# Patient Record
Sex: Male | Born: 1995
Health system: Southern US, Community
[De-identification: ages and names within clinical notes are randomized; demographics above are authoritative.]

## PROBLEM LIST (undated history)

## (undated) DIAGNOSIS — J45909 Unspecified asthma, uncomplicated: Secondary | ICD-10-CM

## (undated) DIAGNOSIS — I456 Pre-excitation syndrome: Secondary | ICD-10-CM

## (undated) HISTORY — DX: Pre-excitation syndrome: I45.6

## (undated) HISTORY — PX: CARDIAC ELECTROPHYSIOLOGY STUDY AND ABLATION: SHX1294

## (undated) NOTE — *Deleted (*Deleted)
HEMATOLOGY/ONCOLOGY CONSULTATION NOTE  Date of Service: 07/24/2020  Patient Care Team: Lewis Moccasin, MD as PCP - General (Family Medicine)  CHIEF COMPLAINTS/PURPOSE OF CONSULTATION:  Leukopenia   HISTORY OF PRESENTING ILLNESS:   Sergio Taylor is a wonderful 27 y.o. male who has been referred to [place] by Dr. Maryelizabeth Rowan for evaluation and management of Leukopenia. The pt reports that he is doing well overall.   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  The pt is not taking any medications regularly and he has not had any recent infections. He notes that he has had stable weight and stable, good energy levels. He denies taking any vitamins or supplements.   The pt notes that he incidentally noticed a mole in 2016 which has not changed in size, color or character.   The pt had left sided walking pneumonia when he was in fourth grade. He hasn't been received any pneumonia vaccinations and he also denies recurrent or frequent infections. The pt denies exposure to any chemicals or radiation or exotic animals.   Of note prior to the patient's visit today, pt has had a US Abdomen completed on 07/29/18 with results revealing No gallstones nor evidence of other hepatobiliary abnormality. No findings to explain the patient's elevated bilirubin. No acute intra-abdominal abnormality is observed.   The pt notes that he ate seafood earlier in the summer and developed SOB which resolved on its own after he presented to the ED on 03/15/18.   Most recent lab results (07/10/18) of CBC w/diff and CMP is as follows: all values are WNL except for WBC at 2.1k, RDW at 35.8, ANC automated at 38.5%, Total Bilirubin at 2.5.   On review of systems, pt reports stable and good energy levels, staying active, eating well, and denies fevers, chills, night sweats, cough, cold, flu-like  symptoms, SOB,CP, abdominal pains, changes in bowel habits, noticing any new lumps or bumps, frequent infections, unexpected weight loss, abdominal pains, testicular pain or swelling, leg swelling, lower abdominal pains, red/swollen joints, and any other symptoms.   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 . On Social Hx the pt denies alcohol use since 2017, denies recreational drug use, and denies smoking cigarettes. Denies any radiation or chemical exposure. He is a Consulting civil engineer at [employer].  On Family Hx the pt denies any blood disorders or liver disease. Paternal grandmother with lupus, and denies RA.  Interval History: Sergio Taylor returns today for management and evaluation of his neutropenia. The patient's last visit with [place] was on 07/29/2019. The pt reports that he is doing well overall.  The pt reports ***  Of note since the patient's last visit, pt has had *** completed on *** with results revealing ***.  Lab results today (07/24/20) of CBC w/diff and CMP is as follows: all values are WNL except for ***.  On review of systems, pt reports *** and denies ***and any other symptoms.   A&P: -Discussed pt labwork today, 07/24/20; *** -***  MEDICAL HISTORY:  Past Medical History:  Diagnosis Date  . Asthma   . 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 CARDIAC ELECTROPHYSIOLOGY STUDY AND ABLATION      SOCIAL HISTORY: Social History   Socioeconomic History  . Marital status: Single  Spouse name: Not on file  . Number of children: Not on file  . Years of education: Not on file  . Highest education level: Not on file  Occupational History  . Not on file  Tobacco Use  . Smoking status: Never Smoker  . Smokeless tobacco: Never Used  Substance and Sexual Activity  . Alcohol use: Never  . Drug use: Never  . Sexual activity: Not on file  Other Topics Concern   . Not on file  Social History Narrative  . Not on file   Social Determinants of Health   Financial Resource Strain:   . Difficulty of Paying Living Expenses: Not on file  Food Insecurity:   . Worried About Programme researcher, broadcasting/film/video in the Last Year: Not on file  . Ran Out of Food in the Last Year: Not on file  Transportation Needs:   . Lack of Transportation (Medical): Not on file  . Lack of Transportation (Non-Medical): Not on file  Physical Activity:   . Days of Exercise per Week: Not on file  . Minutes of Exercise per Session: Not on file  Stress:   . Feeling of Stress : Not on file  Social Connections:   . Frequency of Communication with Friends and Family: Not on file  . Frequency of Social Gatherings with Friends and Family: Not on file  . Attends Religious Services: Not on file  . Active Member of Clubs or Organizations: Not on file  . Attends Banker Meetings: Not on file  . Marital Status: Not on file  Intimate Partner Violence:   . Fear of Current or Ex-Partner: Not on file  . Emotionally Abused: Not on file  . Physically Abused: Not on file  . Sexually Abused: Not on file    FAMILY HISTORY: No family history on file.  ALLERGIES:  has No Known Allergies.  MEDICATIONS:  No current outpatient medications on file.   No current facility-administered medications for this visit.    REVIEW OF SYSTEMS:   A 10+ POINT REVIEW OF SYSTEMS WAS OBTAINED including neurology, dermatology, psychiatry, cardiac, respiratory, lymph, extremities, GI, GU, Musculoskeletal, constitutional, breasts, reproductive, HEENT.  All pertinent positives are noted in the HPI.  All others are negative.   PHYSICAL EXAMINATION:  *** GENERAL:alert, in no acute distress and comfortable SKIN: no acute rashes, no significant lesions EYES: conjunctiva are pink and non-injected, sclera anicteric OROPHARYNX: MMM, no exudates, no oropharyngeal erythema or ulceration NECK: supple, no JVD  LYMPH:  no palpable lymphadenopathy in the cervical, axillary or inguinal regions LUNGS: clear to auscultation b/l with normal respiratory effort HEART: regular rate & rhythm ABDOMEN:  normoactive bowel sounds , non tender, not distended. No palpable hepatosplenomegaly.  Extremity: no pedal edema PSYCH: alert & oriented x 3 with fluent speech NEURO: no focal motor/sensory deficits  LABORATORY DATA:  I have reviewed the data as listed  . CBC Latest Ref Rng & Units 07/29/2019 01/27/2019 07/29/2018  WBC 4.0 - 10.5 K/uL 2.9(L) 3.4(L) 2.3(L)  Hemoglobin 13.0 - 17.0 g/dL 16.1 09.6 04.5  Hematocrit 39 - 52 % 43.0 43.1 44.7  Platelets 150 - 400 K/uL 261 229 247   ANC 1000 . CBC    Component Value Date/Time   WBC 2.9 (L) 07/29/2019 1320   RBC 4.81 07/29/2019 1320   HGB 14.6 07/29/2019 1320   HGB 13.7 01/24/2018 1434   HCT 43.0 07/29/2019 1320   HCT 41.7 07/29/2018 1359   PLT 261 07/29/2019 1320  PLT 246 01/24/2018 1434   MCV 89.4 07/29/2019 1320   MCH 30.4 07/29/2019 1320   MCHC 34.0 07/29/2019 1320   RDW 10.9 (L) 07/29/2019 1320   LYMPHSABS 1.3 07/29/2019 1320   MONOABS 0.4 07/29/2019 1320   EOSABS 0.1 07/29/2019 1320   BASOSABS 0.0 07/29/2019 1320   . CMP Latest Ref Rng & Units 07/29/2019 07/29/2018 03/16/2018  Glucose 70 - 99 mg/dL 80 78 96  BUN 6 - 20 mg/dL 11 8 10   Creatinine 0.61 - 1.24 mg/dL 4.09 8.11 9.14  Sodium 135 - 145 mmol/L 141 144 140  Potassium 3.5 - 5.1 mmol/L 3.9 4.1 3.7  Chloride 98 - 111 mmol/L 105 104 104  CO2 22 - 32 mmol/L 27 31 28   Calcium 8.9 - 10.3 mg/dL 9.3 9.4 9.0  Total Protein 6.5 - 8.1 g/dL 7.5 7.6 -  Total Bilirubin 0.3 - 1.2 mg/dL 2.5(H) 3.0(H) -  Alkaline Phos 38 - 126 U/L 54 56 -  AST 15 - 41 U/L 34 19 -  ALT 0 - 44 U/L 31 12 -    Component     Latest Ref Rng & Units 07/29/2018  Retic Ct Pct     0.4 - 3.1 % 1.1  RBC.     4.22 - 5.81 MIL/uL 4.83  Retic Count, Absolute     19.0 - 186.0 K/uL 51.2  Immature Retic Fract     2.3 -  15.9 % 3.0  Reticulocyte Hemoglobin     >27.9 pg 35.5  Folate, Hemolysate     Not Estab. ng/mL 487.7  HCT     37.5 - 51.0 % 41.7  Folate, RBC     >498 ng/mL 1,170  Vitamin B12     180 - 914 pg/mL 500  LDH     98 - 192 U/L 146  Sed Rate     0 - 16 mm/hr 0     07/10/18 CBC w/diff:    07/10/18 CMP:    RADIOGRAPHIC STUDIES: I have personally reviewed the radiological images as listed and agreed with the findings in the report. No results found.  ASSESSMENT & PLAN:   89 y.o. male with  1. 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 03/16/18 ANC normal at 2.9k, 01/24/18 ANC at 1.5k.  12/11/17 ANC at 700. No anemia or thrombocytopenia at any point. Non-progressive pattern demonstrated by neutropenia. Reviewed the 01/24/18 work up from prior hematology appointment, nl ANCA titers, no Hep B, no Hep C, nor Rheumatoid factor   2. Hyperbilirubinemia CMP from 07/10/18; Total Bilirubin at 2.5, Pt's labs from 12/11/17 shows Total Bilirubin at 1.8.  07/29/18 US Abdomen revealed No gallstones nor evidence of other hepatobiliary abnormality. No findings to explain the patient's elevated bilirubin. No acute intra-abdominal abnormality is observed.   PLAN: ***  FOLLOW UP: ***  The total time spent in the appt was *** minutes and more than 50% was on counseling and direct patient cares.  All of the patient's questions were answered with apparent satisfaction. The patient knows to call the clinic with any problems, questions or concerns.    Wyvonnia Lora MD MS AAHIVMS [facility] Hematology/Oncology Physician [facility]  (Office):       [phone] (Work cell):  [phone] (Fax):           [phone]  07/24/2020 8:46 PM  I, Carollee Herter, am acting as a scribe for Dr. Wyvonnia Lora.   {Add  Scribe Attestation Statement}

---
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
## 2016-10-26 DIAGNOSIS — L720 Epidermal cyst: Secondary | ICD-10-CM | POA: Insufficient documentation

## 2017-03-12 DIAGNOSIS — I456 Pre-excitation syndrome: Secondary | ICD-10-CM

## 2017-03-12 DIAGNOSIS — R002 Palpitations: Secondary | ICD-10-CM | POA: Insufficient documentation

## 2017-03-12 HISTORY — DX: Pre-excitation syndrome: I45.6

## 2017-05-27 DIAGNOSIS — I456 Pre-excitation syndrome: Secondary | ICD-10-CM | POA: Insufficient documentation

## 2017-10-10 DIAGNOSIS — I471 Supraventricular tachycardia: Secondary | ICD-10-CM | POA: Insufficient documentation

## 2017-12-31 ENCOUNTER — Encounter: Payer: Self-pay | Admitting: Hematology and Oncology

## 2018-01-24 ENCOUNTER — Encounter: Payer: Self-pay | Admitting: Hematology and Oncology

## 2018-01-24 ENCOUNTER — Telehealth: Payer: Self-pay

## 2018-01-24 ENCOUNTER — Inpatient Hospital Stay: Payer: 59 | Attending: Hematology and Oncology | Admitting: Hematology and Oncology

## 2018-01-24 ENCOUNTER — Inpatient Hospital Stay: Payer: 59

## 2018-01-24 VITALS — BP 132/69 | HR 58 | Temp 97.6°F | Resp 18 | Ht 71.0 in | Wt 161.2 lb

## 2018-01-24 DIAGNOSIS — D709 Neutropenia, unspecified: Secondary | ICD-10-CM | POA: Insufficient documentation

## 2018-01-24 DIAGNOSIS — I456 Pre-excitation syndrome: Secondary | ICD-10-CM | POA: Diagnosis not present

## 2018-01-24 LAB — CMP (CANCER CENTER ONLY)
ALBUMIN: 4 g/dL (ref 3.5–5.0)
ALK PHOS: 87 U/L (ref 40–150)
ALT: 32 U/L (ref 0–55)
ANION GAP: 9 (ref 3–11)
AST: 36 U/L — AB (ref 5–34)
BUN: 9 mg/dL (ref 7–26)
CALCIUM: 9.9 mg/dL (ref 8.4–10.4)
CO2: 26 mmol/L (ref 22–29)
CREATININE: 0.97 mg/dL (ref 0.70–1.30)
Chloride: 108 mmol/L (ref 98–109)
GFR, Est AFR Am: >60 mL/min (ref >60)
GFR, Estimated: >60 mL/min (ref >60)
GLUCOSE: 72 mg/dL (ref 70–140)
Potassium: 3.7 mmol/L (ref 3.5–5.1)
Sodium: 143 mmol/L (ref 136–145)
Total Bilirubin: 1.1 mg/dL (ref 0.2–1.2)
Total Protein: 8 g/dL (ref 6.4–8.3)

## 2018-01-24 LAB — CBC WITH DIFFERENTIAL (CANCER CENTER ONLY)
Basophils Absolute: 0 10*3/uL (ref 0.0–0.1)
Basophils Relative: 0 %
EOS PCT: 4 %
Eosinophils Absolute: 0.1 10*3/uL (ref 0.0–0.5)
HCT: 40.9 % (ref 38.4–49.9)
Hemoglobin: 13.7 g/dL (ref 13.0–17.1)
Lymphocytes Relative: 42 %
Lymphs Abs: 1.6 10*3/uL (ref 0.9–3.3)
MCH: 30.7 pg (ref 27.2–33.4)
MCHC: 33.5 g/dL (ref 32.0–36.0)
MCV: 91.7 fL (ref 79.3–98.0)
MONO ABS: 0.6 10*3/uL (ref 0.1–0.9)
MONOS PCT: 15 %
Neutro Abs: 1.5 10*3/uL (ref 1.5–6.5)
Neutrophils Relative %: 39 %
PLATELETS: 246 10*3/uL (ref 140–400)
RBC: 4.46 MIL/uL (ref 4.20–5.82)
RDW: 11.6 % (ref 11.0–14.6)
WBC Count: 3.8 10*3/uL — ABNORMAL LOW (ref 4.0–10.3)

## 2018-01-24 NOTE — Telephone Encounter (Signed)
Printed avs and calender of upcoming appointment. Per 4/26 los. Perlov had aval. on 5/3 so he said to place patient on 5/6.

## 2018-01-25 LAB — EPSTEIN-BARR VIRUS EARLY D ANTIGEN ANTIBODY, IGG

## 2018-01-25 LAB — ANCA TITERS
Atypical P-ANCA titer: <1:20 {titer}
P-ANCA: <1:20 {titer}

## 2018-01-25 LAB — HEPATITIS C ANTIBODY (REFLEX): HCV Ab: <0.1 s/co ratio (ref 0.0–0.9)

## 2018-01-25 LAB — CMV ANTIBODY, IGG (EIA)

## 2018-01-25 LAB — HCV COMMENT:

## 2018-01-25 LAB — HEPATITIS B SURFACE ANTIBODY,QUALITATIVE: HEP B S AB: NONREACTIVE

## 2018-01-25 LAB — HEPATITIS B SURFACE ANTIGEN: HEP B S AG: NEGATIVE

## 2018-01-25 LAB — EPSTEIN-BARR VIRUS VCA, IGM

## 2018-01-25 LAB — CMV IGM: CMV IgM: <30 AU/mL (ref 0.0–29.9)

## 2018-01-25 LAB — HEPATITIS B CORE ANTIBODY, TOTAL: HEP B C TOTAL AB: NEGATIVE

## 2018-01-25 LAB — EPSTEIN-BARR VIRUS NUCLEAR ANTIGEN ANTIBODY, IGG

## 2018-01-25 LAB — RHEUMATOID FACTOR: Rhuematoid fact SerPl-aCnc: <10 IU/mL (ref 0.0–13.9)

## 2018-01-25 LAB — EPSTEIN-BARR VIRUS VCA, IGG: EBV VCA IGG: 19 U/mL — AB (ref 0.0–17.9)

## 2018-01-27 LAB — ANTINUCLEAR ANTIBODIES, IFA: ANA Ab, IFA: NEGATIVE

## 2018-01-30 ENCOUNTER — Telehealth: Payer: Self-pay

## 2018-01-30 NOTE — Telephone Encounter (Signed)
Spoke to patient concerning his appointment change and informed him that the date he was requesting was unavailable. Per 5/2 phone message return

## 2018-01-31 ENCOUNTER — Telehealth: Payer: Self-pay

## 2018-01-31 NOTE — Telephone Encounter (Signed)
Patient called to reschedule appointment and asked if lab results could be reviewed by Dr. Perlov to Gweneth Dimitri if a follow up visit is needed. Dr. Gweneth Dimitri reviewed results and stated patient does not need to come back for follow up. Called patient and made him aware. Patient verbalized understanding.

## 2018-02-03 ENCOUNTER — Ambulatory Visit: Payer: 59 | Admitting: Hematology and Oncology

## 2018-02-13 ENCOUNTER — Encounter: Payer: Self-pay | Admitting: Hematology and Oncology

## 2018-02-13 DIAGNOSIS — D709 Neutropenia, unspecified: Secondary | ICD-10-CM | POA: Insufficient documentation

## 2018-02-13 NOTE — Assessment & Plan Note (Signed)
22 y.o. overall healthy young male with minimal past medical history presenting with neutropenia of unknown duration.  First discovered in December 2018.  There has been some possible progression over the following 3 months.  No significant changes in the hemoglobin or platelets over that time.  This has been accompanied by hyperbilirubinemia.  Patient is presently asymptomatic, differential is extremely broad starting with congenital/ethnic neutropenia followed by potential previous viral exposures and possible medication side effects including flecainide, but it is uncommon.  Plan: - Obtain additional labs as outlined below. -Return to clinic in 1 week to review the findings.Marland Kitchen

## 2018-02-13 NOTE — Progress Notes (Signed)
[facility] New Visit:  Assessment: Neutropenia ([place]) 22 y.o. overall healthy young male with minimal past medical history presenting with neutropenia of unknown duration.  First discovered in December 2018.  There has been some possible progression over the following 3 months.  No significant changes in the hemoglobin or platelets over that time.  This has been accompanied by hyperbilirubinemia.  Patient is presently asymptomatic, differential is extremely broad starting with congenital/ethnic neutropenia followed by potential previous viral exposures and possible medication side effects including flecainide, but it is uncommon.  Plan: - Obtain additional labs as outlined below. -Return to clinic in 1 week to review the findings..   Voice recognition software was used and creation of this note. Despite my best effort at editing the text, some misspelling/errors may have occurred. Orders Placed This Encounter  Procedures  . CBC with Differential (Cancer Center Only)    Standing Status:   Future    Number of Occurrences:   1    Standing Expiration Date:   01/25/2019  . CMP ([place] [facility] only)    Standing Status:   Future    Number of Occurrences:   1    Standing Expiration Date:   01/25/2019  . ANA, IFA (with reflex)    Standing Status:   Future    Number of Occurrences:   1    Standing Expiration Date:   01/24/2019  . Rheumatoid factor    Standing Status:   Future    Number of Occurrences:   1    Standing Expiration Date:   01/24/2019  . ANCA [place]    Standing Status:   Future    Number of Occurrences:   1    Standing Expiration Date:   01/24/2019  . CMV IgM    Standing Status:   Future    Number of Occurrences:   1    Standing Expiration Date:   01/24/2019  . CMV antibody, IgG (EIA)    Standing Status:   Future    Number of Occurrences:   1    Standing Expiration Date:   01/24/2019  . Epstein-Barr virus VCA, IgG    Standing Status:   Future    Number of  Occurrences:   1    Standing Expiration Date:   01/24/2019  . Epstein-Barr virus VCA, IgM    Standing Status:   Future    Number of Occurrences:   1    Standing Expiration Date:   01/24/2019  . Epstein-Barr virus early D antigen antibody, IgG    Standing Status:   Future    Number of Occurrences:   1    Standing Expiration Date:   01/24/2019  . Epstein-Barr virus nuclear antigen antibody, IgG    Standing Status:   Future    Number of Occurrences:   1    Standing Expiration Date:   01/24/2019  . Hepatitis B surface antibody    Standing Status:   Future    Number of Occurrences:   1    Standing Expiration Date:   01/24/2019  . Hepatitis B surface antigen    Standing Status:   Future    Number of Occurrences:   1    Standing Expiration Date:   01/24/2019  . Hepatitis B core antibody, total    Standing Status:   Future    Number of Occurrences:   1    Standing Expiration Date:   01/24/2019  . Hepatitis C  antibody (reflex if positive)    Standing Status:   Future    Number of Occurrences:   1    Standing Expiration Date:   01/24/2019    All questions were answered.  . The patient knows to call the clinic with any problems, questions or concerns.  This note was electronically signed.    History of Presenting Illness Sergio Taylor 22 y.o. presenting to the [facility] for evaluation of neutropenic leukopenia, referred by Dr Fanny Bien.  Patient has no chronic medical conditions and does not recall ever having blood drawn prior to December of last year.  Patient denies having any significant recent illness other than feeling sick with what seems to be a viral infection approximately 1.5 weeks ago which resolved on its own.  Patient does not recall any significant periods of illness prior to December 2018 presentation.  At this time, patient denies fevers, chills, night sweats.  No changes to appetite, energy tolerance, or activities of daily living.  Patient denies sinus pain,  oral ulcers, hair loss, sore throat, swollen lymph nodes in the neck, armpits, or groin.  No shortness of breath, chest pain, or cough.  No difficulty swallowing, nausea, vomiting, early satiety, abdominal pain, diarrhea, or constipation.  No dysuria or hematuria..  Oncological/hematological History: --Labs, 09/30/17: WBC 2.5, ANC 1.0, ALC 1.2, Mono 0.2    Hgb 14.8, Plt 249; tBili 2.0; TSH 1.37, HIV negative --Labs, 12/11/17: WBC 2.7, ANC 0.7, ALC 1.6, Mono 0.4, Eos 0.1, Baso 0.1,  Hgb 14.3, Plt 273; tBili 1.8   Medical History: Past Medical History:  Diagnosis Date  . WPW syndrome 03/12/2017    Surgical History: History reviewed. No pertinent surgical history.  Family History: No family history on file.  Social History: Social History   Socioeconomic History  . Marital status: Single    Spouse name: Not on file  . Number of children: Not on file  . Years of education: Not on file  . Highest education level: Not on file  Occupational History  . Not on file  Social Needs  . Financial resource strain: Not on file  . Food insecurity:    Worry: Not on file    Inability: Not on file  . Transportation needs:    Medical: Not on file    Non-medical: Not on file  Tobacco Use  . Smoking status: Not on file  Substance and Sexual Activity  . Alcohol use: Not on file  . Drug use: Not on file  . Sexual activity: Not on file  Lifestyle  . Physical activity:    Days per week: Not on file    Minutes per session: Not on file  . Stress: Not on file  Relationships  . Social connections:    Talks on phone: Not on file    Gets together: Not on file    Attends religious service: Not on file    Active member of club or organization: Not on file    Attends meetings of clubs or organizations: Not on file    Relationship status: Not on file  . Intimate partner violence:    Fear of current or ex partner: Not on file    Emotionally abused: Not on file    Physically abused: Not on file     Forced sexual activity: Not on file  Other Topics Concern  . Not on file  Social History Narrative  . Not on file    Allergies: Not on File  Medications:  No current outpatient medications on file.   No current facility-administered medications for this visit.     Review of Systems: Review of Systems  All other systems reviewed and are negative.    PHYSICAL EXAMINATION Blood pressure 132/69, pulse (!) 58, temperature 97.6 F (36.4 C), temperature source Oral, resp. rate 18, height 5' 11"  (1.803 m), weight 161 lb 3.2 oz (73.1 kg), SpO2 100 %.  ECOG PERFORMANCE STATUS: 0 - Asymptomatic  Physical Exam  Constitutional: He is oriented to person, place, and time. He appears well-developed and well-nourished. No distress.  HENT:  Head: Normocephalic and atraumatic.  Mouth/Throat: Oropharynx is clear and moist. No oropharyngeal exudate.  Eyes: Pupils are equal, round, and reactive to light. Conjunctivae and EOM are normal. No scleral icterus.  Neck: No thyromegaly present.  Cardiovascular: Normal rate, regular rhythm, normal heart sounds and intact distal pulses. Exam reveals no gallop and no friction rub.  No murmur heard. Pulmonary/Chest: Effort normal and breath sounds normal. No stridor. No respiratory distress. He has no wheezes. He has no rales.  Abdominal: Soft. Bowel sounds are normal. He exhibits no distension and no mass. There is no tenderness. There is no guarding.  Musculoskeletal: He exhibits no edema.  Lymphadenopathy:    He has no cervical adenopathy.  Neurological: He is alert and oriented to person, place, and time. He displays normal reflexes. No cranial nerve deficit or sensory deficit.  Skin: Skin is warm and dry. No rash noted. He is not diaphoretic. No erythema. No pallor.     LABORATORY DATA: I have personally reviewed the data as listed: Clinical Support on 01/24/2018  Component Date Value Ref Range Status  . HCV Ab 01/24/2018 <0.1  0.0 - 0.9  s/co ratio Final   Comment: (NOTE) Performed At: [facility] [place], [place] [id number] Rush Farmer MD UD:[id number] Performed at [facility], [place] [address], [place], [place] [address]   . Hep B Core Total Ab 01/24/2018 Negative  Negative Final   Comment: (NOTE) Performed At: [facility] [place], [place] [id number] Rush Farmer MD [id number] Performed at [facility], [place] [address] [place], [place] [address]   . Hepatitis B Surface Ag 01/24/2018 Negative  Negative Final   Comment: (NOTE) Performed At: [facility] [place], [place] [id number] Rush Farmer MD [id number] Performed at [facility], [place] [address], [place], [place] [address]   . Hep B S Ab 01/24/2018 Non Reactive   Final   Comment: (NOTE)              Non Reactive: Inconsistent with immunity,                            less than 10 mIU/mL              Reactive:     Consistent with immunity,                            greater than 9.9 mIU/mL Performed At: [facility] [place], [place] [id number] Rush Farmer MD [id number] Performed at [facility], [place] [address] [place],  [address]   . EBV NA IgG 01/24/2018 <18.0  0.0 - 17.9 U/mL Final   Comment: (NOTE)  Negative        <18.0                                 Equivocal 18.0 - 21.9                                 Positive        >21.9 Performed At: [facility] [place], [place] [id number] Rush Farmer MD [id number] Performed at [facility], [place] [address] [place], [place] [address]   . EBV Early Antigen Ab, IgG 01/24/2018 <9.0  0.0 - 8.9 U/mL Final   Comment: (NOTE)                                 Negative        < 9.0                                  Equivocal  9.0 - 10.9                                 Positive        >10.9 Performed At: [facility] [place], [place] [id number] Rush Farmer MD HW:[id number] Performed at [facility], [place] [address] [place], [place] [address]   . EBV VCA IgM 01/24/2018 <36.0  0.0 - 35.9 U/mL Final   Comment: (NOTE)                                 Negative        <36.0                                 Equivocal 36.0 - 43.9                                 Positive        >43.9 Performed At: [facility] [place], [place] [id number] Rush Farmer MD [id number] Performed at [facility], [place] [address], [place], [place] [address]   . EBV VCA IgG 01/24/2018 19.0* 0.0 - 17.9 U/mL Final   Comment: (NOTE) A second sample should be collected and tested no less than 2-4 weeks.                                 Negative        <18.0                                 Equivocal 18.0 - 21.9                                 Positive        >21.9 Performed At:  [facility] [place], [place] [id number] Rush Farmer MD [id number] Performed at [facility], [place] [address] [place], [place] [address]   . CMV Ab - IgG 01/24/2018 <0.60  0.00 - 0.59 U/mL Final   Comment: (NOTE)                               Negative          <0.60                               Equivocal   0.60 - 0.69                               Positive          >0.69 Performed At: [facility] [place], [place] [id number] Rush Farmer MD [id number] Performed at [facility], [place] [address], [place], [place] [address]   . CMV IgM 01/24/2018 <30.0  0.0 - 29.9 AU/mL Final   Comment: (NOTE)                                Negative         <30.0                                Equivocal  30.0 - 34.9                                Positive          >34.9 A positive result is generally indicative of acute infection, reactivation or persistent IgM production. Performed At: [facility] [place], [place] [id number] Rush Farmer MD [id number] Performed at [facility], [place] [address], [place], [place] [address]   . C-ANCA 01/24/2018 <1:20  Neg:<1:20 titer Final  . P-ANCA 01/24/2018 <1:20  Neg:<1:20 titer Final   Comment: (NOTE) The presence of positive fluorescence exhibiting P-ANCA or C-ANCA patterns alone is not specific for the diagnosis of Wegener's Granulomatosis (WG) or microscopic polyangiitis. Decisions about treatment should not be based solely on ANCA IFA results.  The International ANCA [employer] Consensus recommends follow up testing of positive sera with both PR-3 and MPO-ANCA enzyme immunoassays. As many as 5% serum samples are positive only by EIA. Ref. AM J Clin Pathol 1999;111:507-513.   Marland Kitchen Atypical P-ANCA titer 01/24/2018 <1:20  Neg:<1:20 titer Final   Comment: (NOTE) The atypical pANCA pattern has been observed in a significant percentage of patients with ulcerative colitis, primary sclerosing cholangitis and autoimmune hepatitis. Performed At: [facility] [place], [place] [id number] Rush Farmer MD SA:[id number] Performed at [facility] Laboratory, [place] [address] [place], [place] [address]   . Rhuematoid fact SerPl-aCnc 01/24/2018 <10.0  0.0 - 13.9 IU/mL Final   Comment: (NOTE) Performed At: [facility] [place], [place] [id number] Rush Farmer MD [id number] Performed at [facility], [place] [address], [place], [place] [address]   . ANA Ab, IFA 01/24/2018 Negative   Final   Comment: (NOTE)  Negative   <1:80                                     Borderline  1:80                                     Positive   >1:80 Performed At: [facility] [place], [place] [id number] Rush Farmer MD IZ:[id number] Performed at [facility], [place] [address] [place], [place] [address]   . Sodium 01/24/2018 143  136 - 145 mmol/L Final  . Potassium 01/24/2018 3.7  3.5 - 5.1 mmol/L Final  . Chloride 01/24/2018 108  98 - 109 mmol/L Final  . CO2 01/24/2018 26  22 - 29 mmol/L Final  . Glucose, Bld 01/24/2018 72  70 - 140 mg/dL Final  . BUN 01/24/2018 9  7 - 26 mg/dL Final  . Creatinine 01/24/2018 0.97  0.70 - 1.30 mg/dL Final  . Calcium 01/24/2018 9.9  8.4 - 10.4 mg/dL Final  . Total Protein 01/24/2018 8.0  6.4 - 8.3 g/dL Final  . Albumin 01/24/2018 4.0  3.5 - 5.0 g/dL Final  . AST 01/24/2018 36* 5 - 34 U/L Final  . ALT 01/24/2018 32  0 - 55 U/L Final  . Alkaline Phosphatase 01/24/2018 87  40 - 150 U/L Final  . Total Bilirubin 01/24/2018 1.1  0.2 - 1.2 mg/dL Final  . GFR, Est Non Af Am 01/24/2018 >60  >60 mL/min Final  . GFR, Est AFR Am 01/24/2018 >60  >60 mL/min Final   Comment: (NOTE) The eGFR has been calculated using the CKD EPI equation. This calculation has not been validated in all clinical situations. eGFR's persistently <60 mL/min signify possible Chronic Kidney Disease.   Georgiann Hahn gap 01/24/2018 9  3 - 11 Final   Performed at [facility], [place] [address], [place], [place] [address]  . WBC Count 01/24/2018 3.8* 4.0 - 10.3 K/uL Final  . RBC 01/24/2018 4.46  4.20 - 5.82 MIL/uL Final  . Hemoglobin 01/24/2018 13.7  13.0 - 17.1 g/dL Final  . HCT 01/24/2018 40.9  38.4 - 49.9 % Final  . MCV 01/24/2018 91.7  79.3 - 98.0 fL Final  . MCH 01/24/2018 30.7  27.2 - 33.4 pg Final  . MCHC 01/24/2018 33.5  32.0 - 36.0 g/dL Final  . RDW 01/24/2018 11.6  11.0 - 14.6 % Final  . Platelet Count 01/24/2018 246  140 - 400 K/uL Final  . Neutrophils Relative % 01/24/2018 39  % Final  . Neutro Abs 01/24/2018 1.5  1.5 - 6.5 K/uL Final  . Lymphocytes Relative 01/24/2018 42   % Final  . Lymphs Abs 01/24/2018 1.6  0.9 - 3.3 K/uL Final  . Monocytes Relative 01/24/2018 15  % Final  . Monocytes Absolute 01/24/2018 0.6  0.1 - 0.9 K/uL Final  . Eosinophils Relative 01/24/2018 4  % Final  . Eosinophils Absolute 01/24/2018 0.1  0.0 - 0.5 K/uL Final  . Basophils Relative 01/24/2018 0  % Final  . Basophils Absolute 01/24/2018 0.0  0.0 - 0.1 K/uL Final   Performed at [facility], [place] [address] [place], [place] [address]  . Comment: 01/24/2018 Comment   Final   Comment: (NOTE) Non reactive HCV antibody screen is consistent with  no HCV infection, unless recent infection is suspected or other evidence exists to indicate HCV infection. Performed At: [facility] [place], [place] [id number] Rush Farmer MD [id number] Performed at [facility], [place] [address] [place], [place] [address]          Ardath Sax, MD

## 2018-03-15 ENCOUNTER — Emergency Department (HOSPITAL_COMMUNITY): Payer: 59

## 2018-03-15 ENCOUNTER — Encounter (HOSPITAL_COMMUNITY): Payer: Self-pay | Admitting: Emergency Medicine

## 2018-03-15 ENCOUNTER — Emergency Department (HOSPITAL_COMMUNITY)
Admission: EM | Admit: 2018-03-15 | Discharge: 2018-03-16 | Disposition: A | Discharge status: Home / Self Care | Payer: 59 | Attending: Emergency Medicine | Admitting: Emergency Medicine

## 2018-03-15 DIAGNOSIS — R0602 Shortness of breath: Secondary | ICD-10-CM | POA: Diagnosis not present

## 2018-03-15 DIAGNOSIS — J45909 Unspecified asthma, uncomplicated: Secondary | ICD-10-CM | POA: Insufficient documentation

## 2018-03-15 HISTORY — DX: Unspecified asthma, uncomplicated: J45.909

## 2018-03-15 MED ORDER — ALBUTEROL SULFATE (2.5 MG/3ML) 0.083% IN NEBU
5.0000 mg | INHALATION_SOLUTION | Freq: Once | RESPIRATORY_TRACT | Status: AC
  Administered 2018-03-15: 5 mg via RESPIRATORY_TRACT
  Filled 2018-03-15: qty 6

## 2018-03-15 NOTE — ED Notes (Signed)
Repeat EKG given to Dr Clayborne DanaMesner

## 2018-03-15 NOTE — ED Triage Notes (Signed)
Reports possible allergic reaction tonight.  C/o sob.  Hx of asthma but no issues in years.

## 2018-03-15 NOTE — ED Notes (Signed)
Repeat in 10 to 15 min per Dr Clayborne DanaMesner

## 2018-03-16 LAB — CBC WITH DIFFERENTIAL/PLATELET
Abs Immature Granulocytes: 0 10*3/uL (ref 0.0–0.1)
BASOS PCT: 0 %
Basophils Absolute: 0 10*3/uL (ref 0.0–0.1)
EOS ABS: 0.1 10*3/uL (ref 0.0–0.7)
EOS PCT: 1 %
HEMATOCRIT: 41.7 % (ref 39.0–52.0)
Hemoglobin: 13.6 g/dL (ref 13.0–17.0)
Immature Granulocytes: 0 %
LYMPHS ABS: 1.6 10*3/uL (ref 0.7–4.0)
Lymphocytes Relative: 31 %
MCH: 30 pg (ref 26.0–34.0)
MCHC: 32.6 g/dL (ref 30.0–36.0)
MCV: 91.9 fL (ref 78.0–100.0)
Monocytes Absolute: 0.5 10*3/uL (ref 0.1–1.0)
Monocytes Relative: 11 %
Neutro Abs: 2.9 10*3/uL (ref 1.7–7.7)
Neutrophils Relative %: 57 %
Platelets: 224 10*3/uL (ref 150–400)
RBC: 4.54 MIL/uL (ref 4.22–5.81)
RDW: 11.1 % — AB (ref 11.5–15.5)
WBC: 5.1 10*3/uL (ref 4.0–10.5)

## 2018-03-16 LAB — BASIC METABOLIC PANEL
Anion gap: 8 (ref 5–15)
BUN: 10 mg/dL (ref 6–20)
CALCIUM: 9 mg/dL (ref 8.9–10.3)
CHLORIDE: 104 mmol/L (ref 101–111)
CO2: 28 mmol/L (ref 22–32)
CREATININE: 1 mg/dL (ref 0.61–1.24)
GFR calc Af Amer: >60 mL/min (ref >60)
GFR calc non Af Amer: >60 mL/min (ref >60)
Glucose, Bld: 96 mg/dL (ref 65–99)
Potassium: 3.7 mmol/L (ref 3.5–5.1)
SODIUM: 140 mmol/L (ref 135–145)

## 2018-03-16 LAB — I-STAT TROPONIN, ED: Troponin i, poc: 0 ng/mL (ref 0.00–0.08)

## 2018-03-16 NOTE — ED Provider Notes (Signed)
[facility] EMERGENCY DEPARTMENT Provider Note   CSN: [id number] Arrival date & time: 03/15/18  2243     History   Chief Complaint Chief Complaint  Patient presents with  . Shortness of Breath    HPI Sergio Taylor is a 22 y.o. male.  HPI 22 year old AA male past medical history significant for asthma and WPW syndrome with ablation in 2018 presents to the emergency department today for evaluation of chest tightness and shortness of breath.  Patient states that he is eating with his family this evening in a restaurant approximately 9 PM when he felt chest tightness and some shortness of breath.  Patient denied any wheezing, nausea, vomiting, diaphoresis, palpitations, throat swelling sensation, lightheadedness or dizziness.  Patient was concerned that he may be having a possible allergic reaction to something that he ate.  He has no history of allergies.  Patient does report history of asthma but has had no issues in many years.  Patient also states he has a history of will Parkinson's white requiring ablation last year and 1 to make sure that he was okay this evening.  Patient states that since arriving to the ED his symptoms have significantly improved.  He denies any chest tightness at this time.  Denies any shortness of breath.  Patient was given an albuterol treatment in triage.  Has a history of DVT/PE, prolonged immobilization, recent hospitalization/surgeries, unilateral leg swelling or calf tenderness, hemoptysis, tobacco use.  Pt denies any fever, chill, ha, vision changes, lightheadedness, dizziness, congestion, neck pain,  cough, abd pain, n/v/d, urinary symptoms, change in bowel habits, melena, hematochezia, lower extremity paresthesias.  Past Medical History:  Diagnosis Date  . Asthma   . WPW syndrome 03/12/2017    Patient Active Problem List   Diagnosis Date Noted  . Neutropenia (HCC) 02/13/2018  . Atrial tachycardia (HCC) 10/10/2017  . Evelene Croon Parkinson  White pattern seen on electrocardiogram 05/27/2017  . Heart palpitations 03/12/2017  . WPW syndrome 03/12/2017  . Epidermal inclusion cyst 10/26/2016  . Acne 04/17/2013    Past Surgical History:  Procedure Laterality Date  . CARDIAC ELECTROPHYSIOLOGY STUDY AND ABLATION          Home Medications    Prior to Admission medications   Not on File    Family History No family history on file.  Social History Social History   Tobacco Use  . Smoking status: Never Smoker  . Smokeless tobacco: Never Used  Substance Use Topics  . Alcohol use: Never    Frequency: Never  . Drug use: Never     Allergies   Patient has no known allergies.   Review of Systems Review of Systems  All other systems reviewed and are negative.    Physical Exam Updated Vital Signs BP 102/71 (BP Location: Left Arm)   Pulse 66   Temp 98 F (36.7 C) (Oral)   Resp 19   Ht 5\' 11"  (1.803 m)   Wt 71.2 kg (157 lb)   SpO2 100%   BMI 21.90 kg/m   Physical Exam  Constitutional: He is oriented to person, place, and time. He appears well-developed and well-nourished.  Non-toxic appearance. No distress.  HENT:  Head: Normocephalic and atraumatic.  Nose: Nose normal.  Mouth/Throat: Oropharynx is clear and moist.  No angioedema noted.  Oropharynx clear.  Managing secretions tolerating airway.  Speaking in complete sentences.  Eyes: Pupils are equal, round, and reactive to light. Conjunctivae are normal. Right eye exhibits no discharge. Left eye  exhibits no discharge.  Neck: Normal range of motion. Neck supple. No JVD present. No tracheal deviation present.  Cardiovascular: Normal rate, regular rhythm, normal heart sounds and intact distal pulses. Exam reveals no gallop and no friction rub.  No murmur heard. Pulmonary/Chest: Effort normal and breath sounds normal. No respiratory distress. He has no decreased breath sounds. He has no wheezes. He has no rhonchi. He has no rales. He exhibits no tenderness.   No hypoxia or tachypnea.  Abdominal: Soft. Bowel sounds are normal. He exhibits no distension. There is no tenderness. There is no rebound and no guarding.  Musculoskeletal: Normal range of motion. He exhibits no tenderness.  No lower extremity edema or calf tenderness.  Lymphadenopathy:    He has no cervical adenopathy.  Neurological: He is alert and oriented to person, place, and time.  Skin: Skin is warm and dry. Capillary refill takes less than 2 seconds. No rash noted. He is not diaphoretic.  Psychiatric: His behavior is normal. Judgment and thought content normal.  Nursing note and vitals reviewed.    ED Treatments / Results  Labs (all labs ordered are listed, but only abnormal results are displayed) Labs Reviewed  CBC WITH DIFFERENTIAL/PLATELET - Abnormal; Notable for the following components:      Result Value   RDW 11.1 (*)    All other components within normal limits  BASIC METABOLIC PANEL  I-STAT TROPONIN, ED    EKG EKG Interpretation  Date/Time:  Saturday March 15 2018 23:05:09 EDT Ventricular Rate:  66 PR Interval:  140 QRS Duration: 94 QT Interval:  402 QTC Calculation: 421 R Axis:   75 Text Interpretation:  Normal sinus rhythm Nonspecific ST abnormality Abnormal ECG Confirmed by Gilda CreasePollina, Christopher J [phone]([id number]) on 03/16/2018 4:18:24 AM   Radiology Dg Chest 2 View  Result Date: 03/15/2018 CLINICAL DATA:  Short of breath. Patient complaining of SOB that started tonight. Reports possible allergic reaction. States some generalized chest pain when he first started having SOB, but denies pain now. Hx of asthma in the past. Also hx of WPW syndrome whic.*comment was truncated* EXAM: CHEST - 2 VIEW COMPARISON:  None. FINDINGS: Normal mediastinum and cardiac silhouette. Normal pulmonary vasculature. No evidence of effusion, infiltrate, or pneumothorax. No acute bony abnormality. IMPRESSION: Normal chest radiograph. Electronically Signed   By: Genevive BiStewart  Edmunds M.D.   On:  03/15/2018 23:38    Procedures Procedures (including critical care time)  Medications Ordered in ED Medications  albuterol (PROVENTIL) (2.5 MG/3ML) 0.083% nebulizer solution 5 mg (5 mg Nebulization Given 03/15/18 2258)     Initial Impression / Assessment and Plan / ED Course  I have reviewed the triage vital signs and the nursing notes.  Pertinent labs & imaging results that were available during my care of the patient were reviewed by me and considered in my medical decision making (see chart for details).     Patient presents to the emergency department today for chest tightness and shortness of breath possibly allergic to food that he ate this evening.  Patient's symptoms are completely resolved on my examination.  Patient is  PERC negative.  Vital signs have been very reassuring in the ED.  Heart regular rate and rhythm on exam.  Lungs clear to auscultation bilaterally.  No lower extremity edema or calf tenderness.  Neurovascularly intact in all extremities.  Oropharynx is clear without any signs of angioedema.  Lab work has been reassuring.  No leukocytosis.  Hemoglobin normal.  No significant electrolyte derangement.  Troponin  was negative.  EKG shows normal sinus rhythm with nonspecific ST changes.  No significant delta waves noted and no signs of atrial tachycardia.  No signs of acute ischemia.  Chest x-ray was reassuring as reviewed by myself.  Patient was placed on the monitor without any signs of an atrial tachycardia.  Patient continues to be symptom free.  Unknown etiology of patient's symptoms.  I have low suspicion for reoccurring WPW at this time.  Patient has been watched for several hours in the ED and had no further allergic reaction symptoms.  Patient symptoms resolved without any intervention.  No indication for steroids or Benadryl at this time.  I doubt allergic reaction.  Unknown cause of patient's symptoms.  I have encouraged him to follow-up with his cardiologist.  Have  encouraged him to return the ED if he develops any worsening symptoms.  Clinical presentation does not seem consistent with PE, dissection, ACS.  Pt is hemodynamically stable, in NAD, & able to ambulate in the ED. Evaluation does not show pathology that would require ongoing emergent intervention or inpatient treatment. I explained the diagnosis to the patient. Pain has been managed & has no complaints prior to dc. Pt is comfortable with above plan and is stable for discharge at this time. All questions were answered prior to disposition. Strict return precautions for f/u to the ED were discussed. Encouraged follow up with PCP.  Pt seen and eval by my attending who is agreeable with the above plan.   Final Clinical Impressions(s) / ED Diagnoses   Final diagnoses:  Shortness of breath    ED Discharge Orders    None       Wallace Keller 03/16/18 7829    Gilda Crease, MD 03/16/18 [phone]

## 2018-03-16 NOTE — Discharge Instructions (Addendum)
Unsure what caused the shortness of breath this evening.  I would recommend following up with her primary care doctor and cardiologist this week.  Return to ED develop any worsening symptoms.

## 2018-07-16 ENCOUNTER — Other Ambulatory Visit: Payer: Self-pay | Admitting: Family Medicine

## 2018-07-16 DIAGNOSIS — R17 Unspecified jaundice: Secondary | ICD-10-CM

## 2018-07-18 ENCOUNTER — Telehealth: Payer: Self-pay | Admitting: Hematology

## 2018-07-18 ENCOUNTER — Encounter: Payer: Self-pay | Admitting: Hematology

## 2018-07-18 NOTE — Telephone Encounter (Signed)
New referral received from Dr. Duanne Guess for declining wbc & rising bilirubin. Pt has been cld and scheduled to see Dr. Candise Che on 1pm. Pt aware to arrive 30 minutes early. Letter mailed.

## 2018-07-29 ENCOUNTER — Inpatient Hospital Stay: Payer: 59

## 2018-07-29 ENCOUNTER — Inpatient Hospital Stay: Payer: 59 | Attending: Hematology | Admitting: Hematology

## 2018-07-29 ENCOUNTER — Encounter: Payer: Self-pay | Admitting: Hematology

## 2018-07-29 ENCOUNTER — Ambulatory Visit
Admission: RE | Admit: 2018-07-29 | Discharge: 2018-07-29 | Disposition: A | Discharge status: Home / Self Care | Payer: 59 | Source: Ambulatory Visit | Attending: Family Medicine | Admitting: Family Medicine

## 2018-07-29 VITALS — BP 111/72 | HR 50 | Temp 98.3°F | Resp 19 | Ht 71.0 in | Wt 156.0 lb

## 2018-07-29 DIAGNOSIS — D709 Neutropenia, unspecified: Secondary | ICD-10-CM

## 2018-07-29 DIAGNOSIS — R17 Unspecified jaundice: Secondary | ICD-10-CM | POA: Insufficient documentation

## 2018-07-29 DIAGNOSIS — D72819 Decreased white blood cell count, unspecified: Secondary | ICD-10-CM | POA: Insufficient documentation

## 2018-07-29 LAB — CMP (CANCER CENTER ONLY)
ALBUMIN: 4.2 g/dL (ref 3.5–5.0)
ALK PHOS: 56 U/L (ref 38–126)
ALT: 12 U/L (ref 0–44)
AST: 19 U/L (ref 15–41)
Anion gap: 9 (ref 5–15)
BILIRUBIN TOTAL: 3 mg/dL — AB (ref 0.3–1.2)
BUN: 8 mg/dL (ref 6–20)
CALCIUM: 9.4 mg/dL (ref 8.9–10.3)
CO2: 31 mmol/L (ref 22–32)
CREATININE: 1.05 mg/dL (ref 0.61–1.24)
Chloride: 104 mmol/L (ref 98–111)
GFR, Est AFR Am: >60 mL/min (ref >60)
GLUCOSE: 78 mg/dL (ref 70–99)
POTASSIUM: 4.1 mmol/L (ref 3.5–5.1)
Sodium: 144 mmol/L (ref 135–145)
TOTAL PROTEIN: 7.6 g/dL (ref 6.5–8.1)

## 2018-07-29 LAB — CBC WITH DIFFERENTIAL/PLATELET
Abs Immature Granulocytes: 0 10*3/uL (ref 0.00–0.07)
BASOS ABS: 0 10*3/uL (ref 0.0–0.1)
Basophils Relative: 1 %
Eosinophils Absolute: 0.1 10*3/uL (ref 0.0–0.5)
Eosinophils Relative: 2 %
HEMATOCRIT: 44.7 % (ref 39.0–52.0)
HEMOGLOBIN: 14.9 g/dL (ref 13.0–17.0)
IMMATURE GRANULOCYTES: 0 %
LYMPHS PCT: 47 %
Lymphs Abs: 1.1 10*3/uL (ref 0.7–4.0)
MCH: 30.8 pg (ref 26.0–34.0)
MCHC: 33.3 g/dL (ref 30.0–36.0)
MCV: 92.5 fL (ref 80.0–100.0)
Monocytes Absolute: 0.3 10*3/uL (ref 0.1–1.0)
Monocytes Relative: 12 %
NEUTROS PCT: 38 %
Neutro Abs: 0.9 10*3/uL — ABNORMAL LOW (ref 1.7–7.7)
Platelets: 247 10*3/uL (ref 150–400)
RBC: 4.83 MIL/uL (ref 4.22–5.81)
RDW: 10.9 % — AB (ref 11.5–15.5)
WBC: 2.3 10*3/uL — ABNORMAL LOW (ref 4.0–10.5)
nRBC: 0 % (ref 0.0–0.2)

## 2018-07-29 LAB — RETIC PANEL
Immature Retic Fract: 3 % (ref 2.3–15.9)
RBC.: 4.83 MIL/uL (ref 4.22–5.81)
RETIC CT PCT: 1.1 % (ref 0.4–3.1)
Retic Count, Absolute: 51.2 10*3/uL (ref 19.0–186.0)
Reticulocyte Hemoglobin: 35.5 pg (ref >27.9)

## 2018-07-29 LAB — VITAMIN B12: Vitamin B-12: 500 pg/mL (ref 180–914)

## 2018-07-29 LAB — LACTATE DEHYDROGENASE: LDH: 146 U/L (ref 98–192)

## 2018-07-29 LAB — SEDIMENTATION RATE: Sed Rate: 0 mm/hr (ref 0–16)

## 2018-07-29 NOTE — Patient Instructions (Signed)
Thank you for choosing [facility] to provide your oncology and hematology care.  To afford each patient quality time with our providers, please arrive 30 minutes before your scheduled appointment time.  If you arrive late for your appointment, you may be asked to reschedule.  We strive to give you quality time with our providers, and arriving late affects you and other patients whose appointments are after yours.    If you are a no show for multiple scheduled visits, you may be dismissed from the clinic at the providers discretion.     Again, thank you for choosing [facility], our hope is that these requests will decrease the amount of time that you wait before being seen by our physicians.  ______________________________________________________________________   Should you have questions after your visit to the [facility], please contact our office at [phone] between the hours of 8:30 and 4:30 p.m.    Voicemails left after 4:30p.m will not be returned until the following business day.     For prescription refill requests, please have your pharmacy contact us directly.  Please also try to allow 48 hours for prescription requests.     Please contact the scheduling department for questions regarding scheduling.  For scheduling of procedures such as PET scans, CT scans, MRI, Ultrasound, etc please contact central scheduling at [phone].     Resources For Cancer Patients and Caregivers:    Oncolink.org:  A wonderful resource for patients and healthcare providers for information regarding your disease, ways to tract your treatment, what to expect, etc.      American Cancer Society:  800-227-2345  Can help patients locate various types of support and financial assistance   Cancer Care: 1-800-813-HOPE (4673) Provides financial assistance, online support groups, medication/co-pay assistance.     [place] DSS:  336-641-3447 Where to apply  for food stamps, Medicaid, and utility assistance   Medicare Rights Center: 800-333-4114 Helps people with Medicare understand their rights and benefits, navigate the Medicare system, and secure the quality healthcare they deserve   SCAT: 336-333-6589 [place] [employer]'s shared-ride transportation service for eligible riders who have a disability that prevents them from riding the fixed route bus.     For additional information on assistance programs please contact our social worker:   Abigail Elmore:  [phone]  

## 2018-07-29 NOTE — Progress Notes (Signed)
HEMATOLOGY/ONCOLOGY CONSULTATION NOTE  Date of Service: 07/29/2018  Patient Care Team: Lewis Moccasin, MD as PCP - General (Family Medicine)  CHIEF COMPLAINTS/PURPOSE OF CONSULTATION:  Leukopenia   HISTORY OF PRESENTING ILLNESS:   Sergio Taylor is a wonderful 22 y.o. male who has been referred to [place] by Dr. Maryelizabeth Rowan for evaluation and management of Leukopenia. The pt reports that he is doing well overall.   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  The pt is not taking any medications regularly and he has not had any recent infections. He notes that he has had stable weight and stable, good energy levels. He denies taking any vitamins or supplements.   The pt notes that he incidentally noticed a mole in 2016 which has not changed in size, color or character.   The pt had left sided walking pneumonia when he was in fourth grade. He hasn't been received any pneumonia vaccinations and he also denies recurrent or frequent infections. The pt denies exposure to any chemicals or radiation or exotic animals.   Of note prior to the patient's visit today, pt has had a US Abdomen completed on 07/29/18 with results revealing No gallstones nor evidence of other hepatobiliary abnormality. No findings to explain the patient's elevated bilirubin. No acute intra-abdominal abnormality is observed.   The pt notes that he ate seafood earlier in the summer and developed SOB which resolved on its own after he presented to the ED on 03/15/18.   Most recent lab results (07/10/18) of CBC w/diff and CMP is as follows: all values are WNL except for WBC at 2.1k, RDW at 35.8, ANC automated at 38.5%, Total Bilirubin at 2.5.   On review of systems, pt reports stable and good energy levels, staying active, eating well, and denies fevers, chills, night sweats, cough, cold, flu-like  symptoms, SOB,CP, abdominal pains, changes in bowel habits, noticing any new lumps or bumps, frequent infections, unexpected weight loss, abdominal pains, testicular pain or swelling, leg swelling, lower abdominal pains, red/swollen joints, and any other symptoms.   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 . On Social Hx the pt denies alcohol use since 2017, denies recreational drug use, and denies smoking cigarettes. Denies any radiation or chemical exposure. He is a Consulting civil engineer at [employer].  On Family Hx the pt denies any blood disorders or liver disease. Paternal grandmother with lupus, and denies RA.    MEDICAL HISTORY:  Past Medical History:  Diagnosis Date  . Asthma   . 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 CARDIAC ELECTROPHYSIOLOGY STUDY AND ABLATION      SOCIAL HISTORY: Social History   Socioeconomic History  . Marital status: Single    Spouse name: Not on file  . Number of children: Not on file  . Years of education: Not on file  . Highest education level: Not on file  Occupational History  . Not on file  Social Needs  . Financial resource strain: Not on file  . Food insecurity:    Worry: Not on file    Inability: Not on file  . Transportation needs:    Medical: Not on file    Non-medical: Not on file  Tobacco Use  . Smoking status: Never Smoker  . Smokeless tobacco:  Never Used  Substance and Sexual Activity  . Alcohol use: Never    Frequency: Never  . Drug use: Never  . Sexual activity: Not on file  Lifestyle  . Physical activity:    Days per week: Not on file    Minutes per session: Not on file  . Stress: Not on file  Relationships  . Social connections:    Talks on phone: Not on file    Gets together: Not on file    Attends religious service: Not on file    Active member of club or organization: Not on file    Attends meetings of  clubs or organizations: Not on file    Relationship status: Not on file  . Intimate partner violence:    Fear of current or ex partner: Not on file    Emotionally abused: Not on file    Physically abused: Not on file    Forced sexual activity: Not on file  Other Topics Concern  . Not on file  Social History Narrative  . Not on file    FAMILY HISTORY: History reviewed. No pertinent family history.  ALLERGIES:  has No Known Allergies.  MEDICATIONS:  No current outpatient medications on file.   No current facility-administered medications for this visit.     REVIEW OF SYSTEMS:    10 Point review of Systems was done is negative except as noted above.  PHYSICAL EXAMINATION:  . Vitals:   07/29/18 1302  BP: 111/72  Pulse: (!) 50  Resp: 19  Temp: 98.3 F (36.8 C)  SpO2: 100%   Filed Weights   07/29/18 1302  Weight: 156 lb (70.8 kg)   .Body mass index is 21.76 kg/m.  GENERAL:alert, in no acute distress and comfortable SKIN: no acute rashes, no significant lesions EYES: conjunctiva are pink and non-injected, sclera anicteric OROPHARYNX: MMM, no exudates, no oropharyngeal erythema or ulceration NECK: supple, no JVD LYMPH:  no palpable lymphadenopathy in the cervical, axillary or inguinal regions LUNGS: clear to auscultation b/l with normal respiratory effort HEART: regular rate & rhythm ABDOMEN:  normoactive bowel sounds , non tender, not distended. Extremity: no pedal edema PSYCH: alert & oriented x 3 with fluent speech NEURO: no focal motor/sensory deficits  LABORATORY DATA:  I have reviewed the data as listed  . CBC Latest Ref Rng & Units 07/29/2018 07/29/2018 03/16/2018  WBC 4.0 - 10.5 K/uL 2.3(L) - 5.1  Hemoglobin 13.0 - 17.0 g/dL 40.9 - 81.1  Hematocrit 39.0 - 52.0 % 44.7 41.7 41.7  Platelets 150 - 400 K/uL 247 - 224   ANC 900 . CBC    Component Value Date/Time   WBC 2.3 (L) 07/29/2018 1400   RBC 4.83 07/29/2018 1400   RBC 4.83 07/29/2018 1400    HGB 14.9 07/29/2018 1400   HGB 13.7 01/24/2018 1434   HCT 44.7 07/29/2018 1400   HCT 41.7 07/29/2018 1359   PLT 247 07/29/2018 1400   PLT 246 01/24/2018 1434   MCV 92.5 07/29/2018 1400   MCH 30.8 07/29/2018 1400   MCHC 33.3 07/29/2018 1400   RDW 10.9 (L) 07/29/2018 1400   LYMPHSABS 1.1 07/29/2018 1400   MONOABS 0.3 07/29/2018 1400   EOSABS 0.1 07/29/2018 1400   BASOSABS 0.0 07/29/2018 1400     . CMP Latest Ref Rng & Units 07/29/2018 03/16/2018 01/24/2018  Glucose 70 - 99 mg/dL 78 96 72  BUN 6 - 20 mg/dL 8 10 9   Creatinine 0.61 - 1.24 mg/dL 9.14 7.82 9.56  Sodium 135 - 145 mmol/L 144 140 143  Potassium 3.5 - 5.1 mmol/L 4.1 3.7 3.7  Chloride 98 - 111 mmol/L 104 104 108  CO2 22 - 32 mmol/L 31 28 26   Calcium 8.9 - 10.3 mg/dL 9.4 9.0 9.9  Total Protein 6.5 - 8.1 g/dL 7.6 - 8.0  Total Bilirubin 0.3 - 1.2 mg/dL 3.0(H) - 1.1  Alkaline Phos 38 - 126 U/L 56 - 87  AST 15 - 41 U/L 19 - 36(H)  ALT 0 - 44 U/L 12 - 32   07/10/18 CBC w/diff:    07/10/18 CMP:    RADIOGRAPHIC STUDIES: I have personally reviewed the radiological images as listed and agreed with the findings in the report. US Abdomen Complete  Result Date: 07/29/2018 CLINICAL DATA:  Elevated bilirubin. EXAM: ABDOMEN ULTRASOUND COMPLETE COMPARISON:  None. FINDINGS: Gallbladder: No gallstones or wall thickening visualized. No sonographic Murphy sign noted by sonographer. Common bile duct: Diameter: 2.8 mm Liver: No focal lesion identified. Within normal limits in parenchymal echogenicity. Portal vein is patent on color Doppler imaging with normal direction of blood flow towards the liver. IVC: No abnormality visualized. Pancreas: Visualized portion unremarkable. Spleen: Size and appearance within normal limits. Right Kidney: Length: 10.4 cm. Echogenicity within normal limits. No mass or hydronephrosis visualized. Left Kidney: Length: 10.7 cm. Echogenicity within normal limits. No mass or hydronephrosis visualized. Abdominal  aorta: No aneurysm visualized. Other findings: There is no ascites. IMPRESSION: No gallstones nor evidence of other hepatobiliary abnormality. No findings to explain the patient's elevated bilirubin. No acute intra-abdominal abnormality is observed. Electronically Signed   By: David  [place] M.D.   On: 07/29/2018 11:21    ASSESSMENT & PLAN:  22 y.o. male with  1. Leukopenia 2. Hyperbilirubinemia  PLAN:  -Discussed patient's most recent labs from 07/10/18 ANC at 800. 03/16/18 ANC normal at 2.9k, 01/24/18 ANC at 1.5k.  12/11/17 ANC at 700. No anemia or thrombocytopenia at any point. Non-progressive pattern demonstrated by neutropenia.  -Discussed patient's most recent CMP from 07/10/18; Total Bilirubin at 2.5, Pt's labs from 12/11/17 shows Total Bilirubin at 1.8. -Reviewed the 01/24/18 work up from prior hematology appointment, nl ANCA titers, no Hep B, no Hep C, nor Rheumatoid factor  -Discussed the 07/29/18 US Abdomen which revealed No gallstones nor evidence of other hepatobiliary abnormality. No findings to explain the patient's elevated bilirubin. No acute intra-abdominal abnormality is observed.  -Overall the patient's pattern of neutropenia suggests a chemical exposure, medication effect, or infectious process.or benign ethnic neutropenia (though he has previously had normal ANC of 2900) , ?autoimmune. -Pt denies any chemical exposure, recreational drug use or taking any medications -Congenital neutropenia less likely as pt has not had frequent or recurrent infections and the pt's bone marrow has shown the ability to bounce back with intermittent normalized ANC -Will order blood tests today  -Discussed that a bone marrow examination may be warranted if a progressive pattern is shown by neutropenia to rule out aplastic anemia, or to give a more concrete explanation  -Pt has received flu shot this year, would recommend pneumonia vaccines if neutrophils remain low   . Orders Placed This Encounter    Procedures  . CBC with Differential/Platelet    Standing Status:   Future    Number of Occurrences:   1    Standing Expiration Date:   09/02/2019  . CMP (Cancer Center only)    Standing Status:   Future    Number of Occurrences:   1  Standing Expiration Date:   07/30/2019  . Sedimentation rate    Standing Status:   Future    Number of Occurrences:   1    Standing Expiration Date:   07/30/2019  . Lactate dehydrogenase    Standing Status:   Future    Number of Occurrences:   1    Standing Expiration Date:   07/30/2019  . Retic Panel    Standing Status:   Future    Number of Occurrences:   1    Standing Expiration Date:   07/30/2019  . Vitamin B12    Standing Status:   Future    Number of Occurrences:   1    Standing Expiration Date:   07/29/2019  . Folate RBC    Standing Status:   Future    Number of Occurrences:   1    Standing Expiration Date:   07/30/2019  . CBC with Differential/Platelet    Standing Status:   Future    Standing Expiration Date:   09/02/2019    Labs today RTC with Dr Candise Che in 6 months with labs   All of the patients questions were answered with apparent satisfaction. The patient knows to call the clinic with any problems, questions or concerns.  The total time spent in the appt was 45 minutes and more than 50% was on counseling and direct patient cares.    Wyvonnia Lora MD MS AAHIVMS [facility] Hematology/Oncology Physician [facility]  (Office):       [phone] (Work cell):  [phone] (Fax):           [phone]  07/29/2018 1:53 PM  I, Marcelline Mates, am acting as a scribe for Dr. Candise Che  .I have reviewed the above documentation for accuracy and completeness, and I agree with the above. Johney Maine MD   Labs reviewed No overt etiology noted for isolated neutropenia.  Component     Latest Ref Rng & Units 07/29/2018  Retic Ct Pct     0.4 - 3.1 % 1.1  RBC.     4.22 - 5.81 MIL/uL 4.83  Retic Count, Absolute      19.0 - 186.0 K/uL 51.2  Immature Retic Fract     2.3 - 15.9 % 3.0  Reticulocyte Hemoglobin     >27.9 pg 35.5  Folate, Hemolysate     Not Estab. ng/mL 487.7  HCT     37.5 - 51.0 % 41.7  Folate, RBC     >498 ng/mL 1,170  Vitamin B12     180 - 914 pg/mL 500  LDH     98 - 192 U/L 146  Sed Rate     0 - 16 mm/hr 0

## 2018-07-30 LAB — FOLATE RBC
FOLATE, HEMOLYSATE: 487.7 ng/mL
FOLATE, RBC: 1170 ng/mL (ref >498)
HEMATOCRIT: 41.7 % (ref 37.5–51.0)

## 2019-01-26 NOTE — Progress Notes (Signed)
HEMATOLOGY/ONCOLOGY CONSULTATION NOTE  Date of Service: 01/27/2019  Patient Care Team: Fanny Bien, MD as PCP - General (Family Medicine)  CHIEF COMPLAINTS/PURPOSE OF CONSULTATION:  Leukopenia   HISTORY OF PRESENTING ILLNESS:   Sergio Taylor is a wonderful 23 y.o. male who has been referred to [place] by Dr. Rachell Cipro for evaluation and management of Leukopenia. The pt reports that he is doing well overall.   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  The pt is not taking any medications regularly and he has not had any recent infections. He notes that he has had stable weight and stable, good energy levels. He denies taking any vitamins or supplements.   The pt notes that he incidentally noticed a mole in 2016 which has not changed in size, color or character.   The pt had left sided walking pneumonia when he was in fourth grade. He hasn't been received any pneumonia vaccinations and he also denies recurrent or frequent infections. The pt denies exposure to any chemicals or radiation or exotic animals.   Of note prior to the patient's visit today, pt has had a US Abdomen completed on 07/29/18 with results revealing No gallstones nor evidence of other hepatobiliary abnormality. No findings to explain the patient's elevated bilirubin. No acute intra-abdominal abnormality is observed.   The pt notes that he ate seafood earlier in the summer and developed SOB which resolved on its own after he presented to the ED on 03/15/18.   Most recent lab results (07/10/18) of CBC w/diff and CMP is as follows: all values are WNL except for WBC at 2.1k, RDW at 35.8, ANC automated at 38.5%, Total Bilirubin at 2.5.   On review of systems, pt reports stable and good energy levels, staying active, eating well, and denies fevers, chills, night sweats, cough, cold, flu-like symptoms,  SOB,CP, abdominal pains, changes in bowel habits, noticing any new lumps or bumps, frequent infections, unexpected weight loss, abdominal pains, testicular pain or swelling, leg swelling, lower abdominal pains, red/swollen joints, and any other symptoms.   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 . On Social Hx the pt denies alcohol use since 2017, denies recreational drug use, and denies smoking cigarettes. Denies any radiation or chemical exposure. He is a Ship broker at [employer].  On Family Hx the pt denies any blood disorders or liver disease. Paternal grandmother with lupus, and denies RA.  Interval History:  [name] returns today for management and evaluation of his neutropenia. The patient's last visit with [place] was on 07/29/18. The pt reports that he is doing well overall.  The pt reports that he has not had any concerns for infections in the last 6 months. He denies taking any medications, denies alcohol use and denies chemical or recreational drug use. The pt denies any chemical exposure. He notes that he feels similarly to how he did 6 months ago. The pt notes that he normally has spring allergies but has not had bad allergies recently.  Lab results today (01/27/19) of CBC w/diff is as follows: all values are WNL except for WBC at 3.4k, RDW at 10.8, ANC at 1.3k.  On review of systems, pt reports good energy levels, eating well, staying active, and denies concerns for infections, bone pains, abdominal pains, skin rashes, allergies, mouth sores, noticing new lumps  or bumps, pain along the spine, and any other symptoms.    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    SURGICAL HISTORY: Past Surgical History:  Procedure Laterality Date   CARDIAC ELECTROPHYSIOLOGY STUDY AND ABLATION      SOCIAL HISTORY: Social History   Socioeconomic History   Marital status: Single     Spouse name: Not on file   Number of children: Not on file   Years of education: Not on file   Highest education level: Not on file  Occupational History   Not on file  Social Needs   Financial resource strain: Not on file   Food insecurity:    Worry: Not on file    Inability: Not on file   Transportation needs:    Medical: Not on file    Non-medical: Not on file  Tobacco Use   Smoking status: Never Smoker   Smokeless tobacco: Never Used  Substance and Sexual Activity   Alcohol use: Never    Frequency: Never   Drug use: Never   Sexual activity: Not on file  Lifestyle   Physical activity:    Days per week: Not on file    Minutes per session: Not on file   Stress: Not on file  Relationships   Social connections:    Talks on phone: Not on file    Gets together: Not on file    Attends religious service: Not on file    Active member of club or organization: Not on file    Attends meetings of clubs or organizations: Not on file    Relationship status: Not on file   Intimate partner violence:    Fear of current or ex partner: Not on file    Emotionally abused: Not on file    Physically abused: Not on file    Forced sexual activity: Not on file  Other Topics Concern   Not on file  Social History Narrative   Not on file    FAMILY HISTORY: No family history on file.  ALLERGIES:  has No Known Allergies.  MEDICATIONS:  No current outpatient medications on file.   No current facility-administered medications for this visit.     REVIEW OF SYSTEMS:    A 10+ POINT REVIEW OF SYSTEMS WAS OBTAINED including neurology, dermatology, psychiatry, cardiac, respiratory, lymph, extremities, GI, GU, Musculoskeletal, constitutional, breasts, reproductive, HEENT.  All pertinent positives are noted in the HPI.  All others are negative.   PHYSICAL EXAMINATION:  . Vitals:   01/27/19 1334  BP: 114/70  Pulse: 61  Resp: 18  Temp: 98.3 F (36.8 C)  SpO2: 100%     Filed Weights   01/27/19 1334  Weight: 165 lb 12.8 oz (75.2 kg)   .Body mass index is 23.12 kg/m.  GENERAL:alert, in no acute distress and comfortable SKIN: no acute rashes, no significant lesions EYES: conjunctiva are pink and non-injected, sclera anicteric OROPHARYNX: MMM, no exudates, no oropharyngeal erythema or ulceration NECK: supple, no JVD LYMPH:  no palpable lymphadenopathy in the cervical, axillary or inguinal regions LUNGS: clear to auscultation b/l with normal respiratory effort HEART: regular rate & rhythm ABDOMEN:  normoactive bowel sounds , non tender, not distended. No palpable hepatosplenomegaly.  Extremity: no pedal edema PSYCH: alert & oriented x 3 with fluent speech NEURO: no focal motor/sensory deficits   LABORATORY DATA:  I have reviewed the data as listed  . CBC Latest Ref  Rng & Units 01/27/2019 07/29/2018 07/29/2018  WBC 4.0 - 10.5 K/uL 3.4(L) 2.3(L) -  Hemoglobin 13.0 - 17.0 g/dL 14.6 14.9 -  Hematocrit 39.0 - 52.0 % 43.1 44.7 41.7  Platelets 150 - 400 K/uL 229 247 -   ANC 900 . CBC    Component Value Date/Time   WBC 3.4 (L) 01/27/2019 1307   RBC 4.79 01/27/2019 1307   HGB 14.6 01/27/2019 1307   HGB 13.7 01/24/2018 1434   HCT 43.1 01/27/2019 1307   HCT 41.7 07/29/2018 1359   PLT 229 01/27/2019 1307   PLT 246 01/24/2018 1434   MCV 90.0 01/27/2019 1307   MCH 30.5 01/27/2019 1307   MCHC 33.9 01/27/2019 1307   RDW 10.8 (L) 01/27/2019 1307   LYMPHSABS 1.6 01/27/2019 1307   MONOABS 0.4 01/27/2019 1307   EOSABS 0.1 01/27/2019 1307   BASOSABS 0.0 01/27/2019 1307   . CMP Latest Ref Rng & Units 07/29/2018 03/16/2018 01/24/2018  Glucose 70 - 99 mg/dL 78 96 72  BUN 6 - 20 mg/dL 8 10 9   Creatinine 0.61 - 1.24 mg/dL 1.05 1.00 0.97  Sodium 135 - 145 mmol/L 144 140 143  Potassium 3.5 - 5.1 mmol/L 4.1 3.7 3.7  Chloride 98 - 111 mmol/L 104 104 108  CO2 22 - 32 mmol/L 31 28 26   Calcium 8.9 - 10.3 mg/dL 9.4 9.0 9.9  Total Protein 6.5 - 8.1 g/dL 7.6  - 8.0  Total Bilirubin 0.3 - 1.2 mg/dL 3.0(H) - 1.1  Alkaline Phos 38 - 126 U/L 56 - 87  AST 15 - 41 U/L 19 - 36(H)  ALT 0 - 44 U/L 12 - 32    Component     Latest Ref Rng & Units 07/29/2018  Retic Ct Pct     0.4 - 3.1 % 1.1  RBC.     4.22 - 5.81 MIL/uL 4.83  Retic Count, Absolute     19.0 - 186.0 K/uL 51.2  Immature Retic Fract     2.3 - 15.9 % 3.0  Reticulocyte Hemoglobin     >27.9 pg 35.5  Folate, Hemolysate     Not Estab. ng/mL 487.7  HCT     37.5 - 51.0 % 41.7  Folate, RBC     >498 ng/mL 1,170  Vitamin B12     180 - 914 pg/mL 500  LDH     98 - 192 U/L 146  Sed Rate     0 - 16 mm/hr 0     07/10/18 CBC w/diff:    07/10/18 CMP:    RADIOGRAPHIC STUDIES: I have personally reviewed the radiological images as listed and agreed with the findings in the report. No results found.  ASSESSMENT & PLAN:  23 y.o. male with  1. 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 03/16/18 ANC normal at 2.9k, 01/24/18 ANC at 1.5k.  12/11/17 ANC at 700. No anemia or thrombocytopenia at any point. Non-progressive pattern demonstrated by neutropenia. Reviewed the 01/24/18 work up from prior hematology appointment, nl ANCA titers, no Hep B, no Hep C, nor Rheumatoid factor   2. Hyperbilirubinemia CMP from 07/10/18; Total Bilirubin at 2.5, Pt's labs from 12/11/17 shows Total Bilirubin at 1.8.  07/29/18 US Abdomen revealed No gallstones nor evidence of other hepatobiliary abnormality. No findings to explain the patient's elevated bilirubin. No acute intra-abdominal abnormality is observed.   PLAN: -Discussed pt labwork today, 01/27/19; WBC improved from 2.3k six months ago to 3.4k  today with ANC improved from 900 to 1.3k. HGB and PLT remain normal -Discussed that overall, in light of the patient's normal HGB and PLT counts, the patient's bone marrow appears to be healthy and is functioning well -Discussed that the  pattern of fluctuation excludes congenital concerns or primary bone marrow problem, pt previously had normal ANC of 2900.  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 -Recommend a daily multivitamin to cover any minor nutritional deficiencies -Do not recommend invasive testing at this time, if counts progressively worsen would consider a bone marrow biopsy -Discussed concerning symptoms for the pt to monitor for including fevers, chills, night sweats, or abnormal skin rashes -Pt denies any chemical exposure, recreational drug use or taking any medications -Pt has received flu shot this year, would recommend pneumonia vaccines if neutrophils remain low  -Will see the pt back in 6 months    Orders Placed This Encounter  Procedures   CBC with Differential/Platelet    Standing Status:   Future    Standing Expiration Date:   03/02/2020   CMP ([place] only)    Standing Status:   Future    Standing Expiration Date:   01/27/2020    RTC with Dr Irene Limbo with labs in 6 months   All of the patients questions were answered with apparent satisfaction. The patient knows to call the clinic with any problems, questions or concerns.  The total time spent in the appt was 15 minutes and more than 50% was on counseling and direct patient cares.    Sullivan Lone MD MS AAHIVMS [facility] Hematology/Oncology Physician [facility]  (Office):       [phone] (Work cell):  [phone] (Fax):           [phone]  01/27/2019 2:44 PM  I, Baldwin Jamaica, am acting as a scribe for Dr. Sullivan Lone.   .I have reviewed the above documentation for accuracy and completeness, and I agree with the above. Brunetta Genera MD

## 2019-01-27 ENCOUNTER — Inpatient Hospital Stay: Payer: 59

## 2019-01-27 ENCOUNTER — Telehealth: Payer: Self-pay | Admitting: Hematology

## 2019-01-27 ENCOUNTER — Inpatient Hospital Stay: Payer: 59 | Attending: Hematology | Admitting: Hematology

## 2019-01-27 ENCOUNTER — Other Ambulatory Visit: Payer: Self-pay

## 2019-01-27 VITALS — BP 114/70 | HR 61 | Temp 98.3°F | Resp 18 | Ht 71.0 in | Wt 165.8 lb

## 2019-01-27 DIAGNOSIS — D709 Neutropenia, unspecified: Secondary | ICD-10-CM

## 2019-01-27 DIAGNOSIS — J45909 Unspecified asthma, uncomplicated: Secondary | ICD-10-CM | POA: Diagnosis not present

## 2019-01-27 DIAGNOSIS — D72819 Decreased white blood cell count, unspecified: Secondary | ICD-10-CM | POA: Diagnosis not present

## 2019-01-27 DIAGNOSIS — I456 Pre-excitation syndrome: Secondary | ICD-10-CM | POA: Insufficient documentation

## 2019-01-27 LAB — CBC WITH DIFFERENTIAL/PLATELET
Abs Immature Granulocytes: 0 10*3/uL (ref 0.00–0.07)
Basophils Absolute: 0 10*3/uL (ref 0.0–0.1)
Basophils Relative: 1 %
Eosinophils Absolute: 0.1 10*3/uL (ref 0.0–0.5)
Eosinophils Relative: 4 %
HCT: 43.1 % (ref 39.0–52.0)
Hemoglobin: 14.6 g/dL (ref 13.0–17.0)
Immature Granulocytes: 0 %
Lymphocytes Relative: 45 %
Lymphs Abs: 1.6 10*3/uL (ref 0.7–4.0)
MCH: 30.5 pg (ref 26.0–34.0)
MCHC: 33.9 g/dL (ref 30.0–36.0)
MCV: 90 fL (ref 80.0–100.0)
Monocytes Absolute: 0.4 10*3/uL (ref 0.1–1.0)
Monocytes Relative: 11 %
Neutro Abs: 1.3 10*3/uL — ABNORMAL LOW (ref 1.7–7.7)
Neutrophils Relative %: 39 %
Platelets: 229 10*3/uL (ref 150–400)
RBC: 4.79 MIL/uL (ref 4.22–5.81)
RDW: 10.8 % — ABNORMAL LOW (ref 11.5–15.5)
WBC: 3.4 10*3/uL — ABNORMAL LOW (ref 4.0–10.5)
nRBC: 0 % (ref 0.0–0.2)

## 2019-01-27 NOTE — Telephone Encounter (Signed)
Scheduled appt per 4/28 los. ° °A calendar will be mailed out. °

## 2019-07-28 NOTE — Progress Notes (Signed)
HEMATOLOGY/ONCOLOGY CONSULTATION NOTE  Date of Service: 07/29/2019  Patient Care Team: Fanny Bien, MD as PCP - General (Family Medicine)  CHIEF COMPLAINTS/PURPOSE OF CONSULTATION:  Leukopenia   HISTORY OF PRESENTING ILLNESS:   Sergio Taylor is a wonderful 23 y.o. male who has been referred to [place] by Dr. Rachell Cipro for evaluation and management of Leukopenia. The pt reports that he is doing well overall.   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  The pt is not taking any medications regularly and he has not had any recent infections. He notes that he has had stable weight and stable, good energy levels. He denies taking any vitamins or supplements.   The pt notes that he incidentally noticed a mole in 2016 which has not changed in size, color or character.   The pt had left sided walking pneumonia when he was in fourth grade. He hasn't been received any pneumonia vaccinations and he also denies recurrent or frequent infections. The pt denies exposure to any chemicals or radiation or exotic animals.   Of note prior to the patient's visit today, pt has had a US Abdomen completed on 07/29/18 with results revealing No gallstones nor evidence of other hepatobiliary abnormality. No findings to explain the patient's elevated bilirubin. No acute intra-abdominal abnormality is observed.   The pt notes that he ate seafood earlier in the summer and developed SOB which resolved on its own after he presented to the ED on 03/15/18.   Most recent lab results (07/10/18) of CBC w/diff and CMP is as follows: all values are WNL except for WBC at 2.1k, RDW at 35.8, ANC automated at 38.5%, Total Bilirubin at 2.5.   On review of systems, pt reports stable and good energy levels, staying active, eating well, and denies fevers, chills, night sweats, cough, cold, flu-like  symptoms, SOB,CP, abdominal pains, changes in bowel habits, noticing any new lumps or bumps, frequent infections, unexpected weight loss, abdominal pains, testicular pain or swelling, leg swelling, lower abdominal pains, red/swollen joints, and any other symptoms.   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 . On Social Hx the pt denies alcohol use since 2017, denies recreational drug use, and denies smoking cigarettes. Denies any radiation or chemical exposure. He is a Ship broker at [employer].  On Family Hx the pt denies any blood disorders or liver disease. Paternal grandmother with lupus, and denies RA.  Interval History:  Sergio Taylor returns today for management and evaluation of his neutropenia. The patient's last visit with [place] was on 01/27/2019. The pt reports that he is doing well overall.  The pt reports that he has been eating and sleeping well. Pt has had no infection concerns in the interim, no need for antibiotics. He has not been taking a B-complex vitamin as recommended. Pt notes that he is concerned that he might develop Lupus like his paternal grandmother. He denies any use of herbal supplements and his weight has been steady.   Lab results today (07/29/19) of CBC w/diff and CMP is as follows: all values are WNL except for WBC at 2.9K, RDW at 10.9, Neutro Abs at 1.0K, Total Bilirubin at 2.5.  On review of systems, pt denies infection issues, fevers, chills, night sweats, new bone pain, back pain, abdominal pain, unexpected weight loss and any other symptoms.  MEDICAL HISTORY:  Past Medical History:  Diagnosis Date  . Asthma   . 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 CARDIAC ELECTROPHYSIOLOGY STUDY AND ABLATION      SOCIAL HISTORY: Social History   Socioeconomic History  . Marital status: Single    Spouse name: Not on file  . Number of children: Not on  file  . Years of education: Not on file  . Highest education level: Not on file  Occupational History  . Not on file  Social Needs  . Financial resource strain: Not on file  . Food insecurity    Worry: Not on file    Inability: Not on file  . Transportation needs    Medical: Not on file    Non-medical: Not on file  Tobacco Use  . Smoking status: Never Smoker  . Smokeless tobacco: Never Used  Substance and Sexual Activity  . Alcohol use: Never    Frequency: Never  . Drug use: Never  . Sexual activity: Not on file  Lifestyle  . Physical activity    Days per week: Not on file    Minutes per session: Not on file  . Stress: Not on file  Relationships  . Social Herbalist on phone: Not on file    Gets together: Not on file    Attends religious service: Not on file    Active member of club or organization: Not on file    Attends meetings of clubs or organizations: Not on file    Relationship status: Not on file  . Intimate partner violence    Fear of current or ex partner: Not on file    Emotionally abused: Not on file    Physically abused: Not on file    Forced sexual activity: Not on file  Other Topics Concern  . Not on file  Social History Narrative  . Not on file    FAMILY HISTORY: No family history on file.  ALLERGIES:  has No Known Allergies.  MEDICATIONS:  No current outpatient medications on file.   No current facility-administered medications for this visit.     REVIEW OF SYSTEMS:   A 10+ POINT REVIEW OF SYSTEMS WAS OBTAINED including neurology, dermatology, psychiatry, cardiac, respiratory, lymph, extremities, GI, GU, Musculoskeletal, constitutional, breasts, reproductive, HEENT.  All pertinent positives are noted in the HPI.  All others are negative.   PHYSICAL EXAMINATION:  . Vitals:   07/29/19 1331  BP: 120/72  Pulse: (!) 57  Resp: 18  Temp: 97.8 F (36.6 C)  SpO2: 100%   Filed Weights   07/29/19 1331  Weight: 169 lb 4.8 oz  (76.8 kg)   .Body mass index is 23.61 kg/m.  GENERAL:alert, in no acute distress and comfortable SKIN: no acute rashes, no significant lesions EYES: conjunctiva are pink and non-injected, sclera anicteric OROPHARYNX: MMM, no exudates, no oropharyngeal erythema or ulceration NECK: supple, no JVD LYMPH:  no palpable lymphadenopathy in the cervical, axillary or inguinal regions LUNGS: clear to auscultation b/l with normal respiratory effort HEART: regular rate & rhythm ABDOMEN:  normoactive bowel sounds , non tender, not distended. No palpable hepatosplenomegaly.  Extremity: no pedal edema PSYCH: alert & oriented x 3 with fluent speech NEURO: no focal motor/sensory deficits  LABORATORY DATA:  I have reviewed the data as listed  . CBC Latest Ref Rng & Units 07/29/2019 01/27/2019 07/29/2018  WBC 4.0 - 10.5 K/uL 2.9(L) 3.4(L) 2.3(L)  Hemoglobin 13.0 - 17.0 g/dL 14.6 14.6 14.9  Hematocrit 39.0 - 52.0 % 43.0 43.1 44.7  Platelets 150 - 400 K/uL 261 229 247   ANC 1000 . CBC    Component Value Date/Time   WBC 2.9 (L) 07/29/2019 1320   RBC 4.81 07/29/2019 1320   HGB 14.6 07/29/2019 1320   HGB 13.7 01/24/2018 1434   HCT 43.0 07/29/2019 1320   HCT 41.7 07/29/2018 1359   PLT 261 07/29/2019 1320   PLT 246 01/24/2018 1434   MCV 89.4 07/29/2019 1320   MCH 30.4 07/29/2019 1320   MCHC 34.0 07/29/2019 1320   RDW 10.9 (L) 07/29/2019 1320   LYMPHSABS 1.3 07/29/2019 1320   MONOABS 0.4 07/29/2019 1320   EOSABS 0.1 07/29/2019 1320   BASOSABS 0.0 07/29/2019 1320   . CMP Latest Ref Rng & Units 07/29/2019 07/29/2018 03/16/2018  Glucose 70 - 99 mg/dL 80 78 96  BUN 6 - 20 mg/dL 11 8 10   Creatinine 0.61 - 1.24 mg/dL 1.00 1.05 1.00  Sodium 135 - 145 mmol/L 141 144 140  Potassium 3.5 - 5.1 mmol/L 3.9 4.1 3.7  Chloride 98 - 111 mmol/L 105 104 104  CO2 22 - 32 mmol/L 27 31 28   Calcium 8.9 - 10.3 mg/dL 9.3 9.4 9.0  Total Protein 6.5 - 8.1 g/dL 7.5 7.6 -  Total Bilirubin 0.3 - 1.2 mg/dL 2.5(H)  3.0(H) -  Alkaline Phos 38 - 126 U/L 54 56 -  AST 15 - 41 U/L 34 19 -  ALT 0 - 44 U/L 31 12 -    Component     Latest Ref Rng & Units 07/29/2018  Retic Ct Pct     0.4 - 3.1 % 1.1  RBC.     4.22 - 5.81 MIL/uL 4.83  Retic Count, Absolute     19.0 - 186.0 K/uL 51.2  Immature Retic Fract     2.3 - 15.9 % 3.0  Reticulocyte Hemoglobin     >27.9 pg 35.5  Folate, Hemolysate     Not Estab. ng/mL 487.7  HCT     37.5 - 51.0 % 41.7  Folate, RBC     >498 ng/mL 1,170  Vitamin B12     180 - 914 pg/mL 500  LDH     98 - 192 U/L 146  Sed Rate     0 - 16 mm/hr 0     07/10/18 CBC w/diff:    07/10/18 CMP:    RADIOGRAPHIC STUDIES: I have personally reviewed the radiological images as listed and agreed with the findings in the report. No results found.  ASSESSMENT & PLAN:   23 y.o. male with  1. 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 03/16/18 ANC normal at 2.9k, 01/24/18 ANC at 1.5k.  12/11/17 ANC at 700. No anemia or thrombocytopenia at any point. Non-progressive pattern demonstrated by neutropenia. Reviewed the 01/24/18 work up from prior hematology appointment, nl ANCA titers, no Hep B, no Hep C, nor Rheumatoid factor   2. Hyperbilirubinemia CMP from 07/10/18; Total Bilirubin at 2.5, Pt's labs from 12/11/17 shows Total Bilirubin at 1.8.  07/29/18 US Abdomen revealed No gallstones nor evidence of other hepatobiliary abnormality. No findings to explain the patient's elevated bilirubin. No acute intra-abdominal abnormality is observed.   PLAN: -Discussed pt labwork today, 07/29/19; all values are WNL except for WBC at 2.9K, RDW at 10.9, Neutro Abs at 1.0K, Total Bilirubin at 2.5. -Neutropenia most likely genetic - "  Benign Ethnic Neutropenia"  -Could be a genetic element to neutropenia that could have been associated with genetic abnormalities that caused pt's Wolff-Parkinson-White-Syndrome -No clinical  symptoms from neutropenia -Will continue to monitor with labs -Do not recommend invasive testing at this time, if counts progressively worsen would consider a bone marrow biopsy -In the setting of significant infection, not unreasonable to use an immunostimulant -Advised pt that there is no laboratory evidence of an autoimmune condition  -Recommend a daily multivitamin to cover any minor nutritional deficiencies -Discussed concerning symptoms for the pt to monitor for including fevers, chills, night sweats, or abnormal skin rashes  -Pt will contact with concerns or changes in symptomology  -Will see back in 1 year, if labs stable will discharge.   Orders Placed This Encounter  Procedures  . CBC with Differential/Platelet    Standing Status:   Future    Standing Expiration Date:   09/01/2020  . CMP ([place] only)    Standing Status:   Future    Standing Expiration Date:   07/28/2020   FOLLOW UP: RTC with Dr Irene Limbo with labs in 12 months  The total time spent in the appt was 15 minutes and more than 50% was on counseling and direct patient cares.  All of the patient's questions were answered with apparent satisfaction. The patient knows to call the clinic with any problems, questions or concerns.   Sullivan Lone MD [place] AAHIVMS [facility] Hematology/Oncology Physician [facility]  (Office):       [phone] (Work cell):  [phone] (Fax):           [phone]  07/29/2019 4:27 PM  I, Yevette Edwards, am acting as a scribe for Dr. Sullivan Lone.   .I have reviewed the above documentation for accuracy and completeness, and I agree with the above. Brunetta Genera MD

## 2019-07-29 ENCOUNTER — Inpatient Hospital Stay: Payer: 59 | Attending: Hematology

## 2019-07-29 ENCOUNTER — Inpatient Hospital Stay (HOSPITAL_BASED_OUTPATIENT_CLINIC_OR_DEPARTMENT_OTHER): Payer: 59 | Admitting: Hematology

## 2019-07-29 ENCOUNTER — Other Ambulatory Visit: Payer: Self-pay

## 2019-07-29 VITALS — BP 120/72 | HR 57 | Temp 97.8°F | Resp 18 | Ht 71.0 in | Wt 169.3 lb

## 2019-07-29 DIAGNOSIS — D709 Neutropenia, unspecified: Secondary | ICD-10-CM | POA: Insufficient documentation

## 2019-07-29 DIAGNOSIS — D72819 Decreased white blood cell count, unspecified: Secondary | ICD-10-CM | POA: Insufficient documentation

## 2019-07-29 DIAGNOSIS — I456 Pre-excitation syndrome: Secondary | ICD-10-CM | POA: Insufficient documentation

## 2019-07-29 LAB — CMP (CANCER CENTER ONLY)
ALT: 31 U/L (ref 0–44)
AST: 34 U/L (ref 15–41)
Albumin: 4.1 g/dL (ref 3.5–5.0)
Alkaline Phosphatase: 54 U/L (ref 38–126)
Anion gap: 9 (ref 5–15)
BUN: 11 mg/dL (ref 6–20)
CO2: 27 mmol/L (ref 22–32)
Calcium: 9.3 mg/dL (ref 8.9–10.3)
Chloride: 105 mmol/L (ref 98–111)
Creatinine: 1 mg/dL (ref 0.61–1.24)
GFR, Est AFR Am: >60 mL/min (ref >60)
GFR, Estimated: >60 mL/min (ref >60)
Glucose, Bld: 80 mg/dL (ref 70–99)
Potassium: 3.9 mmol/L (ref 3.5–5.1)
Sodium: 141 mmol/L (ref 135–145)
Total Bilirubin: 2.5 mg/dL — ABNORMAL HIGH (ref 0.3–1.2)
Total Protein: 7.5 g/dL (ref 6.5–8.1)

## 2019-07-29 LAB — CBC WITH DIFFERENTIAL/PLATELET
Abs Immature Granulocytes: 0 10*3/uL (ref 0.00–0.07)
Basophils Absolute: 0 10*3/uL (ref 0.0–0.1)
Basophils Relative: 1 %
Eosinophils Absolute: 0.1 10*3/uL (ref 0.0–0.5)
Eosinophils Relative: 3 %
HCT: 43 % (ref 39.0–52.0)
Hemoglobin: 14.6 g/dL (ref 13.0–17.0)
Immature Granulocytes: 0 %
Lymphocytes Relative: 46 %
Lymphs Abs: 1.3 10*3/uL (ref 0.7–4.0)
MCH: 30.4 pg (ref 26.0–34.0)
MCHC: 34 g/dL (ref 30.0–36.0)
MCV: 89.4 fL (ref 80.0–100.0)
Monocytes Absolute: 0.4 10*3/uL (ref 0.1–1.0)
Monocytes Relative: 14 %
Neutro Abs: 1 10*3/uL — ABNORMAL LOW (ref 1.7–7.7)
Neutrophils Relative %: 36 %
Platelets: 261 10*3/uL (ref 150–400)
RBC: 4.81 MIL/uL (ref 4.22–5.81)
RDW: 10.9 % — ABNORMAL LOW (ref 11.5–15.5)
WBC: 2.9 10*3/uL — ABNORMAL LOW (ref 4.0–10.5)
nRBC: 0 % (ref 0.0–0.2)

## 2019-07-30 ENCOUNTER — Telehealth: Payer: Self-pay | Admitting: Hematology

## 2019-07-30 NOTE — Telephone Encounter (Signed)
Scheduled per los. Called and left msg. Mailed printout  °

## 2020-02-27 IMAGING — US US ABDOMEN COMPLETE
1 series · 14 of 25 positions shown · non-contrast
Comparison: None.

CLINICAL DATA: Elevated bilirubin.

EXAM:
ABDOMEN ULTRASOUND COMPLETE

[Series 1: us abdomen complete · 0.15mm/px · 14 of 102 slices shown]
[im 1/102]
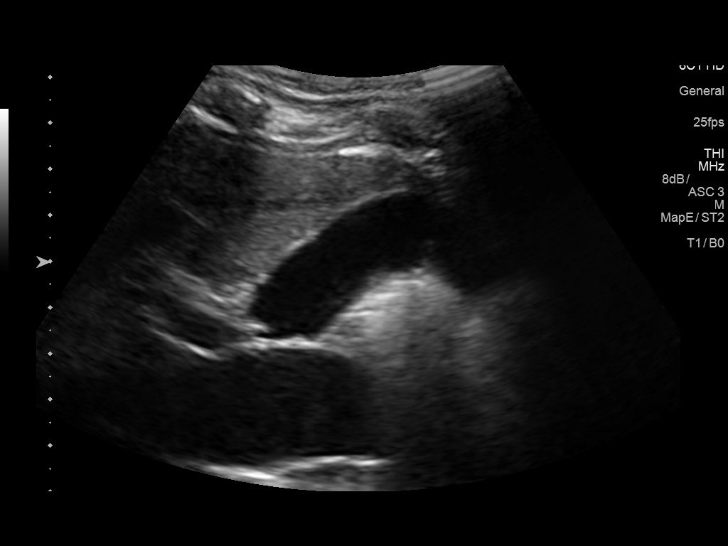
[im 9/102]
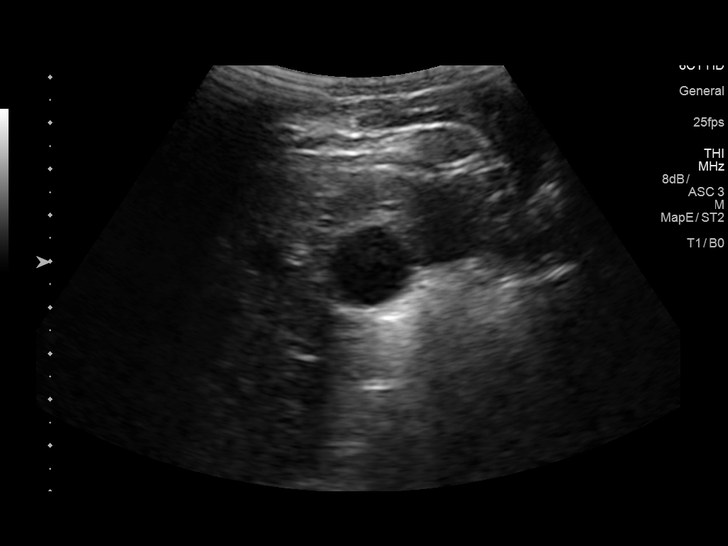
[im 17/102]
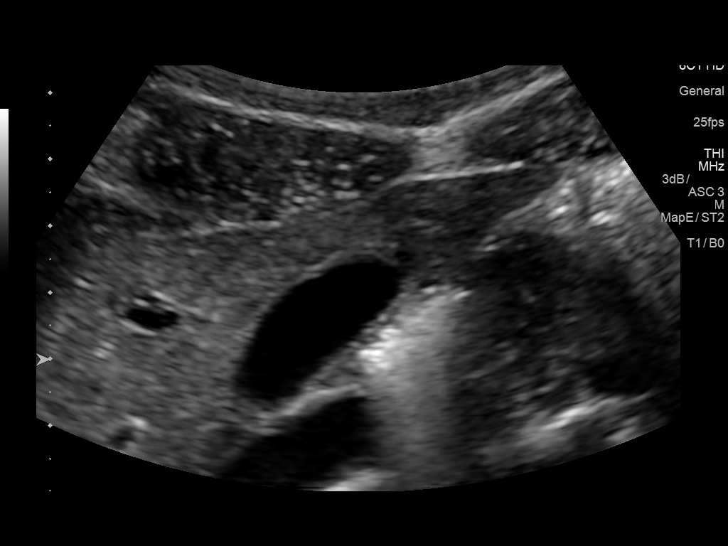
[im 26/102]
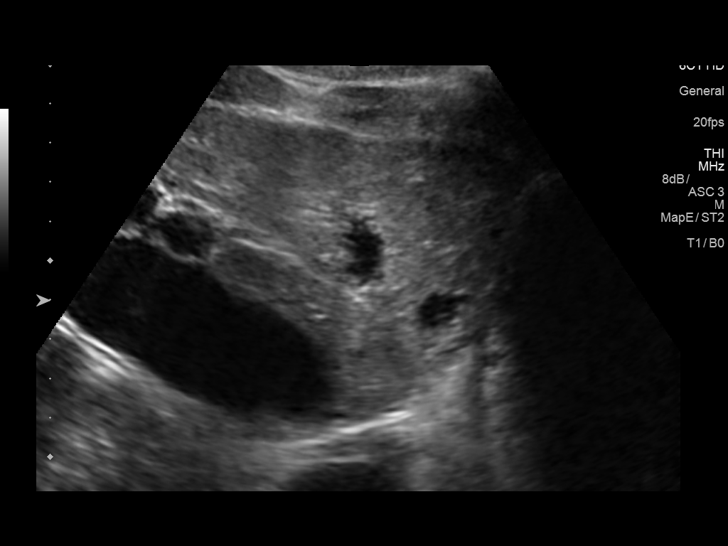
[im 34/102]
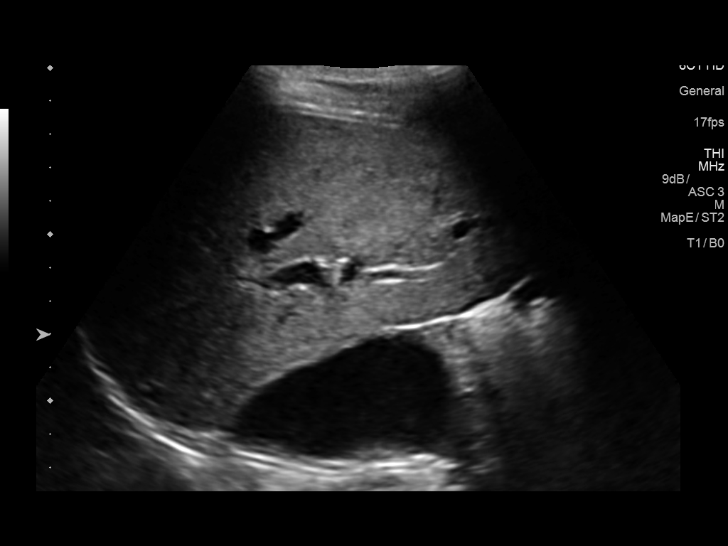
[im 38/102]
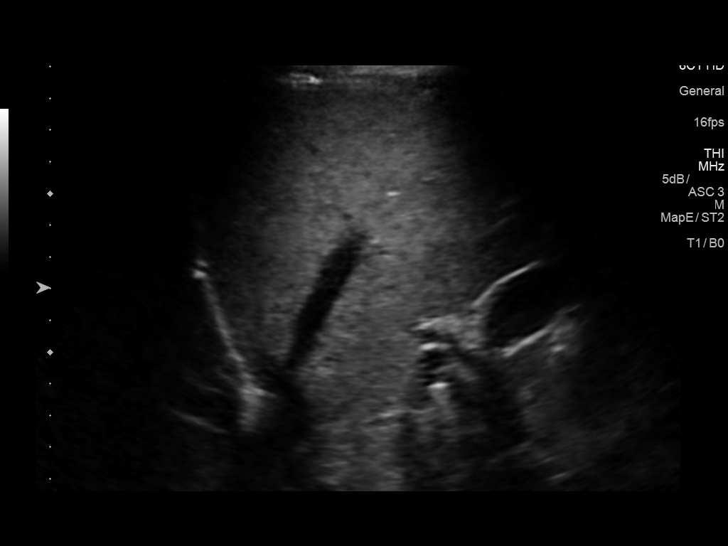
[im 47/102]
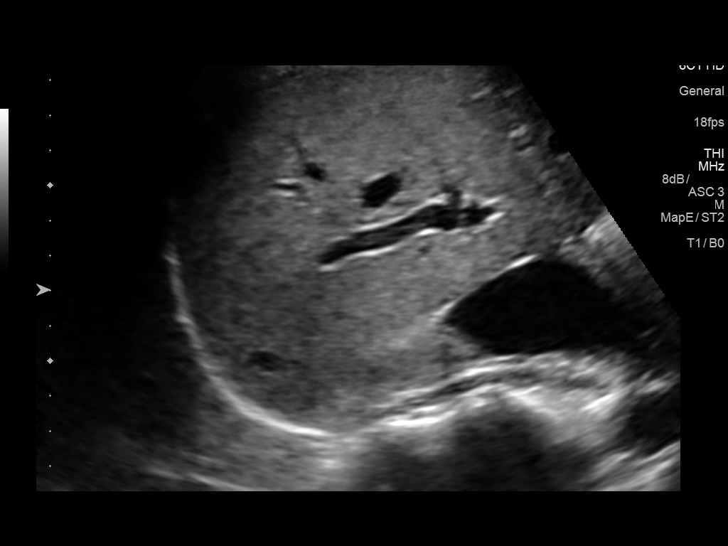
[im 55/102]
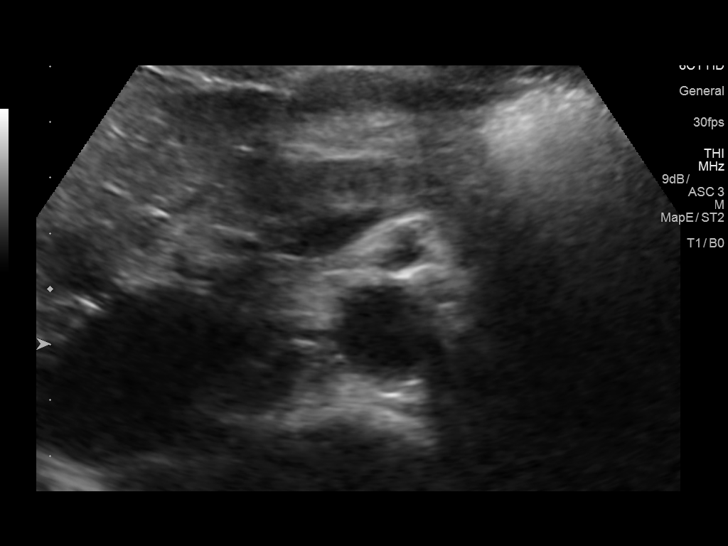
[im 64/102]
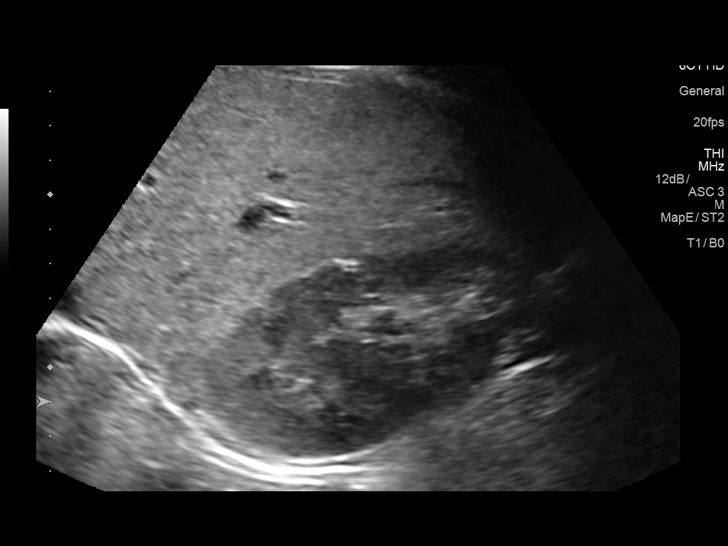
[im 68/102]
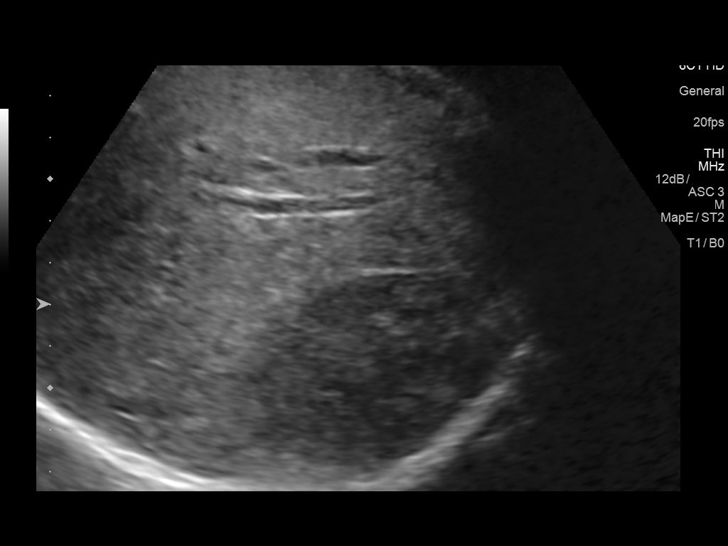
[im 76/102]
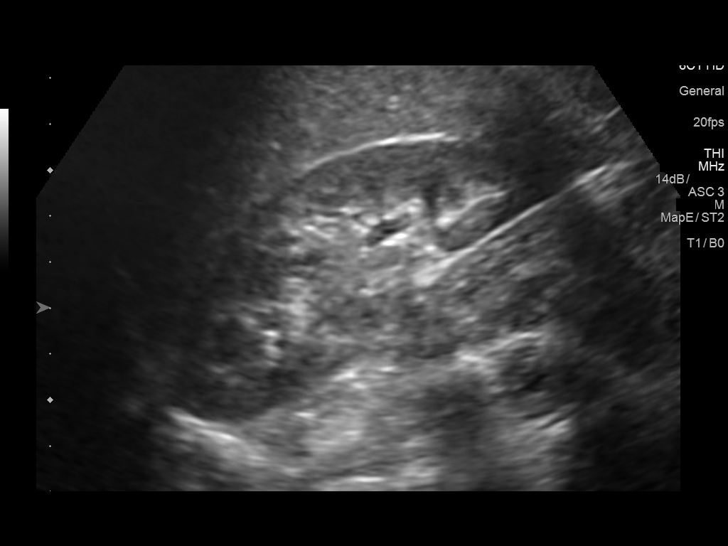
[im 85/102]
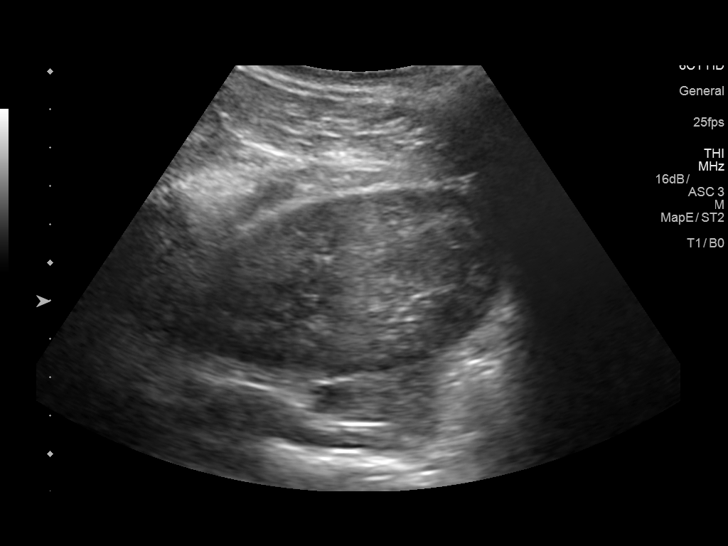
[im 93/102]
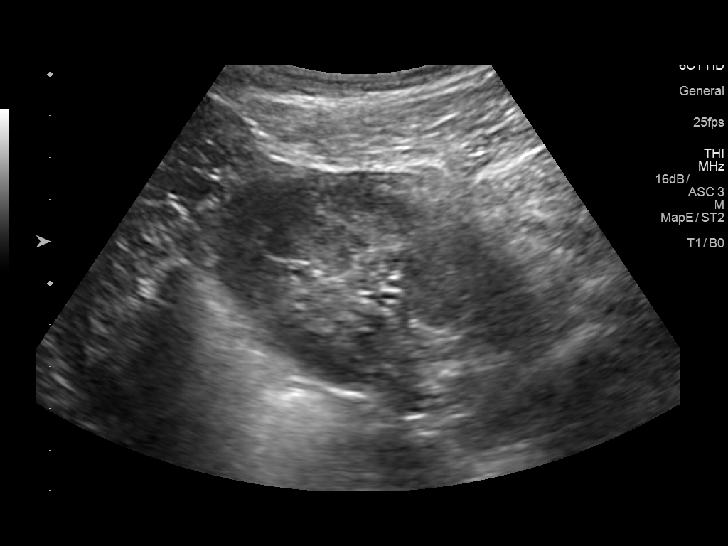
[im 102/102]
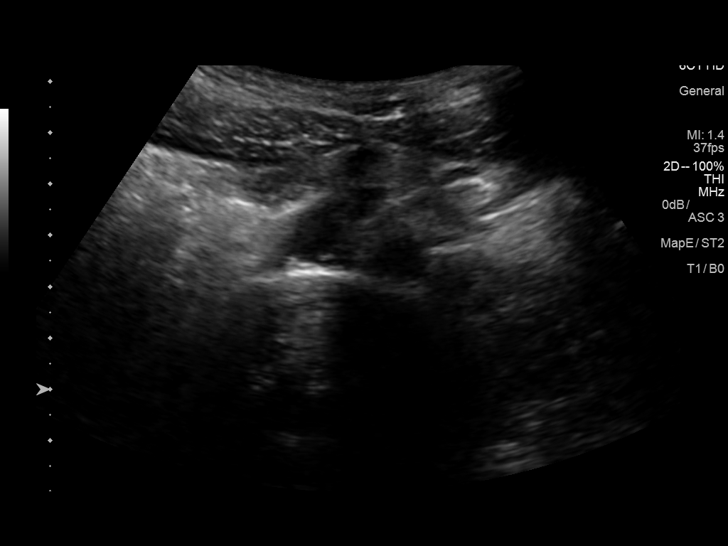

[14 of 25 positions shown; findings below may reference images not displayed]

FINDINGS: Gallbladder: No gallstones or wall thickening visualized. No
sonographic Murphy sign noted by sonographer.

Common bile duct: Diameter: 2.8 mm

Liver: No focal lesion identified. Within normal limits in
parenchymal echogenicity. Portal vein is patent on color Doppler
imaging with normal direction of blood flow towards the liver.

IVC: No abnormality visualized.

Pancreas: Visualized portion unremarkable.

Spleen: Size and appearance within normal limits.

Right Kidney: Length: 10.4 cm. Echogenicity within normal limits. No
mass or hydronephrosis visualized.

Left Kidney: Length: 10.7 cm. Echogenicity within normal limits. No
mass or hydronephrosis visualized.

Abdominal aorta: No aneurysm visualized.

Other findings: There is no ascites.
IMPRESSION: No gallstones nor evidence of other hepatobiliary abnormality. No
findings to explain the patient's elevated bilirubin.

No acute intra-abdominal abnormality is observed.

## 2020-07-28 ENCOUNTER — Inpatient Hospital Stay: Payer: 59

## 2020-07-28 ENCOUNTER — Inpatient Hospital Stay: Payer: 59 | Attending: Hematology | Admitting: Hematology

## 2020-08-05 ENCOUNTER — Telehealth: Payer: Self-pay | Admitting: Hematology

## 2020-08-05 ENCOUNTER — Other Ambulatory Visit: Payer: Self-pay

## 2020-08-05 ENCOUNTER — Inpatient Hospital Stay (HOSPITAL_BASED_OUTPATIENT_CLINIC_OR_DEPARTMENT_OTHER): Payer: 59 | Admitting: Hematology

## 2020-08-05 ENCOUNTER — Inpatient Hospital Stay: Payer: 59 | Attending: Hematology

## 2020-08-05 VITALS — BP 115/77 | HR 57 | Temp 96.9°F | Resp 18 | Ht 71.0 in | Wt 158.9 lb

## 2020-08-05 DIAGNOSIS — I456 Pre-excitation syndrome: Secondary | ICD-10-CM | POA: Diagnosis not present

## 2020-08-05 DIAGNOSIS — D72819 Decreased white blood cell count, unspecified: Secondary | ICD-10-CM | POA: Insufficient documentation

## 2020-08-05 DIAGNOSIS — D709 Neutropenia, unspecified: Secondary | ICD-10-CM

## 2020-08-05 LAB — CBC WITH DIFFERENTIAL/PLATELET
Abs Immature Granulocytes: 0 10*3/uL (ref 0.00–0.07)
Basophils Absolute: 0 10*3/uL (ref 0.0–0.1)
Basophils Relative: 1 %
Eosinophils Absolute: 0.1 10*3/uL (ref 0.0–0.5)
Eosinophils Relative: 2 %
HCT: 42.4 % (ref 39.0–52.0)
Hemoglobin: 14.4 g/dL (ref 13.0–17.0)
Immature Granulocytes: 0 %
Lymphocytes Relative: 43 %
Lymphs Abs: 1 10*3/uL (ref 0.7–4.0)
MCH: 30.5 pg (ref 26.0–34.0)
MCHC: 34 g/dL (ref 30.0–36.0)
MCV: 89.8 fL (ref 80.0–100.0)
Monocytes Absolute: 0.3 10*3/uL (ref 0.1–1.0)
Monocytes Relative: 12 %
Neutro Abs: 0.9 10*3/uL — ABNORMAL LOW (ref 1.7–7.7)
Neutrophils Relative %: 42 %
Platelets: 247 10*3/uL (ref 150–400)
RBC: 4.72 MIL/uL (ref 4.22–5.81)
RDW: 10.7 % — ABNORMAL LOW (ref 11.5–15.5)
WBC: 2.2 10*3/uL — ABNORMAL LOW (ref 4.0–10.5)
nRBC: 0 % (ref 0.0–0.2)

## 2020-08-05 NOTE — Telephone Encounter (Signed)
Scheduled per 11/5 los. Printed avs and calendar for pt.  

## 2020-08-05 NOTE — Progress Notes (Signed)
HEMATOLOGY/ONCOLOGY CONSULTATION NOTE  Date of Service: 08/05/2020  Patient Care Team: Lewis Moccasin, MD as PCP - General (Family Medicine)  CHIEF COMPLAINTS/PURPOSE OF CONSULTATION:  Leukopenia   HISTORY OF PRESENTING ILLNESS:   Sergio Taylor is a wonderful 24 y.o. male who has been referred to [place] by Dr. Maryelizabeth Rowan for evaluation and management of Leukopenia. The pt reports that he is doing well overall.   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  The pt is not taking any medications regularly and he has not had any recent infections. He notes that he has had stable weight and stable, good energy levels. He denies taking any vitamins or supplements.   The pt notes that he incidentally noticed a mole in 2016 which has not changed in size, color or character.   The pt had left sided walking pneumonia when he was in fourth grade. He hasn't been received any pneumonia vaccinations and he also denies recurrent or frequent infections. The pt denies exposure to any chemicals or radiation or exotic animals.   Of note prior to the patient's visit today, pt has had a US Abdomen completed on 07/29/18 with results revealing No gallstones nor evidence of other hepatobiliary abnormality. No findings to explain the patient's elevated bilirubin. No acute intra-abdominal abnormality is observed.   The pt notes that he ate seafood earlier in the summer and developed SOB which resolved on its own after he presented to the ED on 03/15/18.   Most recent lab results (07/10/18) of CBC w/diff and CMP is as follows: all values are WNL except for WBC at 2.1k, RDW at 35.8, ANC automated at 38.5%, Total Bilirubin at 2.5.   On review of systems, pt reports stable and good energy levels, staying active, eating well, and denies fevers, chills, night sweats, cough, cold, flu-like symptoms,  SOB,CP, abdominal pains, changes in bowel habits, noticing any new lumps or bumps, frequent infections, unexpected weight loss, abdominal pains, testicular pain or swelling, leg swelling, lower abdominal pains, red/swollen joints, and any other symptoms.   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 . On Social Hx the pt denies alcohol use since 2017, denies recreational drug use, and denies smoking cigarettes. Denies any radiation or chemical exposure. He is a Consulting civil engineer at [employer].  On Family Hx the pt denies any blood disorders or liver disease. Paternal grandmother with lupus, and denies RA.  Interval History: [name] returns today for management and evaluation of his neutropenia. The patient's last visit with [place] was on 07/29/2019. The pt reports that he is doing well overall.  The pt reports that he was found to be allergic to shellfish. He notes tingling lips and shortness of breath when eating shellfish previously. Pt has had his COVID19 vaccine, but has not received his flu vaccine. Pt denies any recent medication changes or infections. He has not been taking his B-complex as recommended. He denies any seasonal allergies. His asthma is stable and he does not require an inhaler at this time. Pt has a follow up with his PCP, Maryelizabeth Rowan, in February.   Lab results today (08/05/20) of CBC w/diff is as follows: all values are WNL except for WBC at 2.2K, RDW at 10.7, Neutro Abs at 0.9K.  On review of systems, pt denies fevers, chills, night sweats, bone  pain, rash, joint swelling, mouth sores, abdominal pain and any other symptoms.    MEDICAL HISTORY:  Past Medical History:  Diagnosis Date  . Asthma   . 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 CARDIAC ELECTROPHYSIOLOGY STUDY AND ABLATION      SOCIAL HISTORY: Social History   Socioeconomic History  . Marital  status: Single    Spouse name: Not on file  . Number of children: Not on file  . Years of education: Not on file  . Highest education level: Not on file  Occupational History  . Not on file  Tobacco Use  . Smoking status: Never Smoker  . Smokeless tobacco: Never Used  Substance and Sexual Activity  . Alcohol use: Never  . Drug use: Never  . Sexual activity: Not on file  Other Topics Concern  . Not on file  Social History Narrative  . Not on file   Social Determinants of Health   Financial Resource Strain:   . Difficulty of Paying Living Expenses: Not on file  Food Insecurity:   . Worried About Programme researcher, broadcasting/film/videounning Out of Food in the Last Year: Not on file  . Ran Out of Food in the Last Year: Not on file  Transportation Needs:   . Lack of Transportation (Medical): Not on file  . Lack of Transportation (Non-Medical): Not on file  Physical Activity:   . Days of Exercise per Week: Not on file  . Minutes of Exercise per Session: Not on file  Stress:   . Feeling of Stress : Not on file  Social Connections:   . Frequency of Communication with Friends and Family: Not on file  . Frequency of Social Gatherings with Friends and Family: Not on file  . Attends Religious Services: Not on file  . Active Member of Clubs or Organizations: Not on file  . Attends BankerClub or Organization Meetings: Not on file  . Marital Status: Not on file  Intimate Partner Violence:   . Fear of Current or Ex-Partner: Not on file  . Emotionally Abused: Not on file  . Physically Abused: Not on file  . Sexually Abused: Not on file    FAMILY HISTORY: No family history on file.  ALLERGIES:  has No Known Allergies.  MEDICATIONS:  No current outpatient medications on file.   No current facility-administered medications for this visit.    REVIEW OF SYSTEMS:   A 10+ POINT REVIEW OF SYSTEMS WAS OBTAINED including neurology, dermatology, psychiatry, cardiac, respiratory, lymph, extremities, GI, GU, Musculoskeletal,  constitutional, breasts, reproductive, HEENT.  All pertinent positives are noted in the HPI.  All others are negative.   PHYSICAL EXAMINATION:  . Vitals:   08/05/20 0950  BP: 115/77  Pulse: (!) 57  Resp: 18  Temp: (!) 96.9 F (36.1 C)  SpO2: 100%   Filed Weights   08/05/20 0950  Weight: 158 lb 14.4 oz (72.1 kg)   .Body mass index is 22.16 kg/m.   GENERAL:alert, in no acute distress and comfortable SKIN: no acute rashes, no significant lesions EYES: conjunctiva are pink and non-injected, sclera anicteric OROPHARYNX: MMM, no exudates, no oropharyngeal erythema or ulceration NECK: supple, no JVD LYMPH:  no palpable lymphadenopathy in the cervical, axillary or inguinal regions LUNGS: clear to auscultation b/l with normal respiratory effort HEART: regular rate & rhythm ABDOMEN:  normoactive bowel sounds , non tender, not distended. No palpable hepatosplenomegaly.  Extremity: no pedal edema PSYCH: alert & oriented  x 3 with fluent speech NEURO: no focal motor/sensory deficits  LABORATORY DATA:  I have reviewed the data as listed  . CBC Latest Ref Rng & Units 08/05/2020 07/29/2019 01/27/2019  WBC 4.0 - 10.5 K/uL 2.2(L) 2.9(L) 3.4(L)  Hemoglobin 13.0 - 17.0 g/dL 73.4 28.7 68.1  Hematocrit 39 - 52 % 42.4 43.0 43.1  Platelets 150 - 400 K/uL 247 261 229   ANC 1000 . CBC    Component Value Date/Time   WBC 2.2 (L) 08/05/2020 0924   RBC 4.72 08/05/2020 0924   HGB 14.4 08/05/2020 0924   HGB 13.7 01/24/2018 1434   HCT 42.4 08/05/2020 0924   HCT 41.7 07/29/2018 1359   PLT 247 08/05/2020 0924   PLT 246 01/24/2018 1434   MCV 89.8 08/05/2020 0924   MCH 30.5 08/05/2020 0924   MCHC 34.0 08/05/2020 0924   RDW 10.7 (L) 08/05/2020 0924   LYMPHSABS 1.0 08/05/2020 0924   MONOABS 0.3 08/05/2020 0924   EOSABS 0.1 08/05/2020 0924   BASOSABS 0.0 08/05/2020 0924   . CMP Latest Ref Rng & Units 07/29/2019 07/29/2018 03/16/2018  Glucose 70 - 99 mg/dL 80 78 96  BUN 6 - 20 mg/dL 11 8 10     Creatinine 0.61 - 1.24 mg/dL 1.57 2.62  Sodium 135 - 145 mmol/L 141 144 140  Potassium 3.5 - 5.1 mmol/L 3.9 4.1 3.7  Chloride 98 - 111 mmol/L 105 104 104  CO2 22 - 32 mmol/L 27 31 28   Calcium 8.9 - 10.3 mg/dL 9.3 9.4 9.0  Total Protein 6.5 - 8.1 g/dL 7.5 7.6 -  Total Bilirubin 0.3 - 1.2 mg/dL 2.5(H) 3.0(H) -  Alkaline Phos 38 - 126 U/L 54 56 -  AST 15 - 41 U/L 34 19 -  ALT 0 - 44 U/L 31 12 -    Component     Latest Ref Rng & Units 07/29/2018  Retic Ct Pct     0.4 - 3.1 % 1.1  RBC.     4.22 - 5.81 MIL/uL 4.83  Retic Count, Absolute     19.0 - 186.0 K/uL 51.2  Immature Retic Fract     2.3 - 15.9 % 3.0  Reticulocyte Hemoglobin     >27.9 pg 35.5  Folate, Hemolysate     Not Estab. ng/mL 487.7  HCT     37.5 - 51.0 % 41.7  Folate, RBC     >498 ng/mL 1,170  Vitamin B12     180 - 914 pg/mL 500  LDH     98 - 192 U/L 146  Sed Rate     0 - 16 mm/hr 0     07/10/18 CBC w/diff:    07/10/18 CMP:    RADIOGRAPHIC STUDIES: I have personally reviewed the radiological images as listed and agreed with the findings in the report. No results found.  ASSESSMENT & PLAN:   24 y.o. male with  1. 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 03/16/18 ANC normal at 2.9k, 01/24/18 ANC at 1.5k.  12/11/17 ANC at 700. No anemia or thrombocytopenia at any point. Non-progressive pattern demonstrated by neutropenia. Reviewed the 01/24/18 work up from prior hematology appointment, nl ANCA titers, no Hep B, no Hep C, nor Rheumatoid factor   2. Hyperbilirubinemia CMP from 07/10/18; Total Bilirubin at 2.5, Pt's labs from 12/11/17 shows Total Bilirubin at 1.8.  07/29/18 12/13/17 Abdomen revealed No gallstones nor evidence of other hepatobiliary abnormality. No findings to  explain the patient's elevated bilirubin. No acute intra-abdominal abnormality is observed.   PLAN: -Discussed pt labwork today, 08/05/20; Neutropenia is  stable, other blood counts are nml.  -Advised pt that his neutrophils have varied over the course of our observation, but no progressive decline - WBC increased during the period of pt using a nebulizer.  -Advised pt that his other blood counts being nml and his lack of infections is reassuring.  -Advised pt that the most likely explanation is Benign Ethnic Neutropenia or a genetic change causing mild, cyclic neutropenia.  -Advised pt that a BM Bx would likely give [place] a clearer explanation, but is not expressly indicated at this time - pt agrees. -Recommend a daily multivitamin to cover any minor nutritional deficiencies. -Recommend pt receive the annual flu vaccine.  -Recommend pt f/u with PCP as scheduled. -Will see back in 12 months with labs    FOLLOW UP: RTC with Dr Candise Che with labs in 12 months   The total time spent in the appt was 20 minutes and more than 50% was on counseling and direct patient cares.  All of the patient's questions were answered with apparent satisfaction. The patient knows to call the clinic with any problems, questions or concerns.    Wyvonnia Lora MD MS AAHIVMS [facility] Hematology/Oncology Physician [facility]  (Office):       [phone] (Work cell):  [phone] (Fax):           [phone]  08/05/2020 10:40 AM  I, Carollee Herter, am acting as a scribe for Dr. Wyvonnia Lora.   .I have reviewed the above documentation for accuracy and completeness, and I agree with the above. Johney Maine MD

## 2021-08-03 ENCOUNTER — Telehealth: Payer: Self-pay | Admitting: Hematology

## 2021-08-03 NOTE — Telephone Encounter (Signed)
Rescheduled upcoming appointment per patient's request. Patient is aware of changes. 

## 2021-08-04 ENCOUNTER — Inpatient Hospital Stay: Payer: Self-pay | Admitting: Hematology

## 2021-08-04 ENCOUNTER — Inpatient Hospital Stay: Payer: Self-pay

## 2021-08-08 ENCOUNTER — Other Ambulatory Visit: Payer: Self-pay

## 2021-08-08 DIAGNOSIS — D709 Neutropenia, unspecified: Secondary | ICD-10-CM

## 2021-08-09 ENCOUNTER — Other Ambulatory Visit: Payer: Self-pay

## 2021-08-09 ENCOUNTER — Inpatient Hospital Stay (HOSPITAL_BASED_OUTPATIENT_CLINIC_OR_DEPARTMENT_OTHER): Payer: 59 | Admitting: Hematology

## 2021-08-09 ENCOUNTER — Inpatient Hospital Stay: Payer: 59 | Attending: Hematology

## 2021-08-09 VITALS — BP 101/64 | HR 63 | Temp 98.1°F | Resp 16 | Ht 71.0 in | Wt 159.0 lb

## 2021-08-09 DIAGNOSIS — D709 Neutropenia, unspecified: Secondary | ICD-10-CM | POA: Diagnosis not present

## 2021-08-09 DIAGNOSIS — D72819 Decreased white blood cell count, unspecified: Secondary | ICD-10-CM | POA: Diagnosis not present

## 2021-08-09 LAB — CMP (CANCER CENTER ONLY)
ALT: 16 U/L (ref 0–44)
AST: 25 U/L (ref 15–41)
Albumin: 4.2 g/dL (ref 3.5–5.0)
Alkaline Phosphatase: 60 U/L (ref 38–126)
Anion gap: 7 (ref 5–15)
BUN: 10 mg/dL (ref 6–20)
CO2: 28 mmol/L (ref 22–32)
Calcium: 9.2 mg/dL (ref 8.9–10.3)
Chloride: 106 mmol/L (ref 98–111)
Creatinine: 0.89 mg/dL (ref 0.61–1.24)
GFR, Estimated: >60 mL/min (ref >60)
Glucose, Bld: 78 mg/dL (ref 70–99)
Potassium: 3.8 mmol/L (ref 3.5–5.1)
Sodium: 141 mmol/L (ref 135–145)
Total Bilirubin: 3.2 mg/dL — ABNORMAL HIGH (ref 0.3–1.2)
Total Protein: 7.7 g/dL (ref 6.5–8.1)

## 2021-08-09 LAB — CBC WITH DIFFERENTIAL (CANCER CENTER ONLY)
Abs Immature Granulocytes: 0 10*3/uL (ref 0.00–0.07)
Basophils Absolute: 0 10*3/uL (ref 0.0–0.1)
Basophils Relative: 1 %
Eosinophils Absolute: 0 10*3/uL (ref 0.0–0.5)
Eosinophils Relative: 1 %
HCT: 43.6 % (ref 39.0–52.0)
Hemoglobin: 14.6 g/dL (ref 13.0–17.0)
Immature Granulocytes: 0 %
Lymphocytes Relative: 29 %
Lymphs Abs: 0.8 10*3/uL (ref 0.7–4.0)
MCH: 30.2 pg (ref 26.0–34.0)
MCHC: 33.5 g/dL (ref 30.0–36.0)
MCV: 90.3 fL (ref 80.0–100.0)
Monocytes Absolute: 0.4 10*3/uL (ref 0.1–1.0)
Monocytes Relative: 15 %
Neutro Abs: 1.4 10*3/uL — ABNORMAL LOW (ref 1.7–7.7)
Neutrophils Relative %: 54 %
Platelet Count: 238 10*3/uL (ref 150–400)
RBC: 4.83 MIL/uL (ref 4.22–5.81)
RDW: 11 % — ABNORMAL LOW (ref 11.5–15.5)
WBC Count: 2.6 10*3/uL — ABNORMAL LOW (ref 4.0–10.5)
nRBC: 0 % (ref 0.0–0.2)

## 2021-08-09 LAB — LACTATE DEHYDROGENASE: LDH: 189 U/L (ref 98–192)

## 2021-08-09 NOTE — Progress Notes (Signed)
HEMATOLOGY/ONCOLOGY CLINIC NOTE  Date of Service: 08/09/2021  Patient Care Team: Fanny Bien, MD as PCP - General (Family Medicine)  CHIEF COMPLAINTS/PURPOSE OF CONSULTATION:  Chronic nonprogressive leukopenia/neutropenia  HISTORY OF PRESENTING ILLNESS:   Sergio Taylor is a wonderful 25 y.o. male who has been referred to [place] by Dr. Rachell Cipro for evaluation and management of Leukopenia. The pt reports that he is doing well overall.   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  The pt is not taking any medications regularly and he has not had any recent infections. He notes that he has had stable weight and stable, good energy levels. He denies taking any vitamins or supplements.   The pt notes that he incidentally noticed a mole in 2016 which has not changed in size, color or character.   The pt had left sided walking pneumonia when he was in fourth grade. He hasn't been received any pneumonia vaccinations and he also denies recurrent or frequent infections. The pt denies exposure to any chemicals or radiation or exotic animals.   Of note prior to the patient's visit today, pt has had a US Abdomen completed on 07/29/18 with results revealing No gallstones nor evidence of other hepatobiliary abnormality. No findings to explain the patient's elevated bilirubin. No acute intra-abdominal abnormality is observed.   The pt notes that he ate seafood earlier in the summer and developed SOB which resolved on its own after he presented to the ED on 03/15/18.   Most recent lab results (07/10/18) of CBC w/diff and CMP is as follows: all values are WNL except for WBC at 2.1k, RDW at 35.8, ANC automated at 38.5%, Total Bilirubin at 2.5.   On review of systems, pt reports stable and good energy levels, staying active, eating well, and denies fevers, chills, night sweats,  cough, cold, flu-like symptoms, SOB,CP, abdominal pains, changes in bowel habits, noticing any new lumps or bumps, frequent infections, unexpected weight loss, abdominal pains, testicular pain or swelling, leg swelling, lower abdominal pains, red/swollen joints, and any other symptoms.   On PMHx the pt reports Sergio Taylor White syndrome with Ablation on 04/24/17. . On Social Hx the pt denies alcohol use since 2017, denies recreational drug use, and denies smoking cigarettes. Denies any radiation or chemical exposure. He is a Ship broker at [employer].  On Family Hx the pt denies any blood disorders or liver disease. Paternal grandmother with lupus, and denies RA.  Interval History:  Sergio Taylor returns today for management and evaluation of his neutropenia. The patient's last visit with [place] was on 08/05/2020.  Patient notes he has been doing well over the last year and has no acute new symptoms.  No infection issues.  No fevers no chills no night sweats no new fatigue no bleeding issues. No abdominal pain or distention.  No new bone pains. No unexpected weight loss.  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  Lab results today (08/09/21) of CBC w/diff is as follows: all values are WNL except for WBC at 2.6K, RDW at 10.7, Neutro Abs at 1.4k which is up from 0.9k about a year ago.  Normal hemoglobin at 14.6, normal platelets at 238k. CMP shows bilirubin still elevated at 3.2.  On review of systems,  pt denies fevers, chills, night sweats, bone pain, rash, joint swelling, mouth sores, abdominal pain and any other symptoms.    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    SURGICAL HISTORY: Past Surgical History:  Procedure Laterality Date   CARDIAC ELECTROPHYSIOLOGY STUDY AND ABLATION      SOCIAL  HISTORY: Social History   Socioeconomic History   Marital status: Single    Spouse name: Not on file   Number of children: Not on file   Years of education: Not on file   Highest education level: Not on file  Occupational History   Not on file  Tobacco Use   Smoking status: Never   Smokeless tobacco: Never  Substance and Sexual Activity   Alcohol use: Never   Drug use: Never   Sexual activity: Not on file  Other Topics Concern   Not on file  Social History Narrative   Not on file   Social Determinants of Health   Financial Resource Strain: Not on file  Food Insecurity: Not on file  Transportation Needs: Not on file  Physical Activity: Not on file  Stress: Not on file  Social Connections: Not on file  Intimate Partner Violence: Not on file    FAMILY HISTORY: No family history on file.  ALLERGIES:  is allergic to shellfish allergy.  MEDICATIONS:  Current Outpatient Medications  Medication Sig Dispense Refill   flecainide (TAMBOCOR) 50 MG tablet Take by mouth.     metoprolol tartrate (LOPRESSOR) 25 MG tablet Take by mouth.     No current facility-administered medications for this visit.    REVIEW OF SYSTEMS:   .10 Point review of Systems was done is negative except as noted above.   PHYSICAL EXAMINATION:  . Vitals:   08/09/21 1118  BP: 101/64  Pulse: 63  Resp: 16  Temp: 98.1 F (36.7 C)  SpO2: 100%   Filed Weights   08/09/21 1118  Weight: 159 lb (72.1 kg)   .Body mass index is 22.18 kg/m.   Marland Kitchen GENERAL:alert, in no acute distress and comfortable SKIN: no acute rashes, no significant lesions EYES: conjunctiva are pink and non-injected, sclera anicteric OROPHARYNX: MMM, no exudates, no oropharyngeal erythema or ulceration NECK: supple, no JVD LYMPH:  no palpable lymphadenopathy in the cervical, axillary or inguinal regions LUNGS: clear to auscultation b/l with normal respiratory effort HEART: regular rate & rhythm ABDOMEN:  normoactive bowel  sounds , non tender, not distended.  No palpable hepatosplenomegaly Extremity: no pedal edema PSYCH: alert & oriented x 3 with fluent speech NEURO: no focal motor/sensory deficits   LABORATORY DATA:  I have reviewed the data as listed  . CBC Latest Ref Rng & Units 08/09/2021 08/05/2020 07/29/2019  WBC 4.0 - 10.5 K/uL 2.6(L) 2.2(L) 2.9(L)  Hemoglobin 13.0 - 17.0 g/dL 14.6 14.4 14.6  Hematocrit 39.0 - 52.0 % 43.6 42.4 43.0  Platelets 150 - 400 K/uL 238 247 261   ANC 1400 . CBC    Component Value Date/Time   WBC 2.6 (L) 08/09/2021 1107   WBC 2.2 (L) 08/05/2020 0924   RBC 4.83 08/09/2021 1107   HGB 14.6 08/09/2021 1107   HCT 43.6 08/09/2021 1107   HCT 41.7 07/29/2018 1359   PLT 238 08/09/2021 1107   MCV 90.3 08/09/2021 1107   MCH 30.2 08/09/2021 1107   MCHC 33.5 08/09/2021 1107   RDW 11.0 (L) 08/09/2021 1107   LYMPHSABS 0.8 08/09/2021 1107  MONOABS 0.4 08/09/2021 1107   EOSABS 0.0 08/09/2021 1107   BASOSABS 0.0 08/09/2021 1107   . CMP Latest Ref Rng & Units 08/09/2021 07/29/2019 07/29/2018  Glucose 70 - 99 mg/dL 78 80 78  BUN 6 - 20 mg/dL _0 Creatinine 0.61 - 1.24 mg/dL 0.89 1.00 1.05  Sodium 135 - 145 mmol/L 141 141 144  Potassium 3.5 - 5.1 mmol/L 3.8 3.9 4.1  Chloride 98 - 111 mmol/L 106 105 104  CO2 22 - 32 mmol/L _1 Calcium 8.9 - 10.3 mg/dL 9.2 9.3 9.4  Total Protein 6.5 - 8.1 g/dL 7.7 7.5 7.6  Total Bilirubin 0.3 - 1.2 mg/dL 3.2(H) 2.5(H) 3.0(H)  Alkaline Phos 38 - 126 U/L 60 54 56  AST 15 - 41 U/L 25 34 19  ALT 0 - 44 U/L _2 Component     Latest Ref Rng & Units 07/29/2018  Retic Ct Pct     0.4 - 3.1 % 1.1  RBC.     4.22 - 5.81 MIL/uL 4.83  Retic Count, Absolute     19.0 - 186.0 K/uL 51.2  Immature Retic Fract     2.3 - 15.9 % 3.0  Reticulocyte Hemoglobin     >27.9 pg 35.5  Folate, Hemolysate     Not Estab. ng/mL 487.7  HCT     37.5 - 51.0 % 41.7  Folate, RBC     >498 ng/mL 1,170  Vitamin B12     180 - 914 pg/mL 500  LDH      98 - 192 U/L 146  Sed Rate     0 - 16 mm/hr 0     07/10/18 CBC w/diff:    07/10/18 CMP:    RADIOGRAPHIC STUDIES: I have personally reviewed the radiological images as listed and agreed with the findings in the report. No results found.  ASSESSMENT & PLAN:   25 y.o. male with  1. 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 03/16/18 ANC normal at 2.9k, 01/24/18 ANC at 1.5k.  12/11/17 ANC at 700. No anemia or thrombocytopenia at any point. Non-progressive pattern demonstrated by neutropenia. Reviewed the 01/24/18 work up from prior hematology appointment, nl ANCA titers, no Hep B, no Hep C, nor Rheumatoid factor   PLAN: -Discussed pt labwork today, 08/09/21; Neutropenia is improved at 1.4k (up from 0.9k), other blood counts are nml.  -Advised pt that his neutrophils have varied over the course of our observation, but no progressive decline - WBC increased since his last clinic visit. -Advised pt that his other blood counts being nml and his lack of infections is reassuring.  There has been no progression or worsening of blood counts to suggest aplastic anemia or other more significant bone marrow disorders. -Advised pt that the most likely explanation is Benign Ethnic Neutropenia or a genetic change causing mild, cyclic neutropenia or possible fluctuation in immune mediated neutropenia related to allergies.  -We discussed in the past and has decided against pursuing a bone marrow biopsy if all is stable on his counts and symptoms which is the case today. -Recommend he continue a daily multivitamin to cover any minor nutritional deficiencies. -Recommend pt receive the annual flu vaccine and other age-appropriate vaccinations -Recommend pt f/u with PCP as scheduled.  Would recommend at least yearly labs to monitor blood counts.  If there is progressive worsening of the neutropenia or other  developing  cytopenias would recommend referring back to [place] for further evaluation. At this time we will release the patient back to his primary care physician.  2. Hyperbilirubinemia-could be related to Gilbert's syndrome Bilirubin has remained in the 2-3 range. No evidence of hemolysis based on previously normal LDH and normal hemoglobin levels. 07/29/18 US Abdomen revealed No gallstones nor evidence of other hepatobiliary abnormality. No findings to explain the patient's elevated bilirubin. No acute intra-abdominal abnormality is observed.  PLAN -Would recommend follow-up with primary care physician for further work-up.  Could be Gilbert's syndrome could be tested for by primary care physician or PCP to consider referral to hematology.  FOLLOW UP: RTC with Dr Irene Limbo as needed   . The total time spent in the appointment was 20 minutes and more than 50% was on counseling and direct patient cares.  All of the patient's questions were answered with apparent satisfaction. The patient knows to call the clinic with any problems, questions or concerns.    Sullivan Lone MD Otho AAHIVMS [facility] Hematology/Oncology Physician [facility]
